# Patient Record
Sex: Female | Born: 1965 | Race: Black or African American | Hispanic: No | Marital: Single | Smoking: Never smoker
Health system: Southern US, Community
[De-identification: ages and names within clinical notes are randomized; demographics above are authoritative.]

## PROBLEM LIST (undated history)

## (undated) DIAGNOSIS — K838 Other specified diseases of biliary tract: Secondary | ICD-10-CM

## (undated) HISTORY — PX: TONSILLECTOMY: SUR1361

---

## 1988-02-14 DIAGNOSIS — K838 Other specified diseases of biliary tract: Secondary | ICD-10-CM

## 1988-02-14 HISTORY — DX: Other specified diseases of biliary tract: K83.8

## 1988-02-14 HISTORY — PX: TUBAL LIGATION: SHX77

## 2010-02-13 HISTORY — PX: BILIARY TUBE PLACEMENT (ARMC HX): HXRAD1689

## 2014-09-24 ENCOUNTER — Other Ambulatory Visit (HOSPITAL_BASED_OUTPATIENT_CLINIC_OR_DEPARTMENT_OTHER): Payer: Self-pay | Admitting: Chiropractor

## 2014-09-24 DIAGNOSIS — M545 Low back pain: Secondary | ICD-10-CM

## 2014-09-24 DIAGNOSIS — M542 Cervicalgia: Secondary | ICD-10-CM

## 2014-09-30 ENCOUNTER — Other Ambulatory Visit (HOSPITAL_BASED_OUTPATIENT_CLINIC_OR_DEPARTMENT_OTHER): Payer: Self-pay | Admitting: Chiropractor

## 2014-09-30 DIAGNOSIS — M5020 Other cervical disc displacement, unspecified cervical region: Secondary | ICD-10-CM

## 2014-09-30 DIAGNOSIS — M542 Cervicalgia: Secondary | ICD-10-CM

## 2014-09-30 DIAGNOSIS — S242XXA Injury of nerve root of thoracic spine, initial encounter: Secondary | ICD-10-CM

## 2014-09-30 DIAGNOSIS — M5430 Sciatica, unspecified side: Secondary | ICD-10-CM

## 2014-09-30 DIAGNOSIS — M5126 Other intervertebral disc displacement, lumbar region: Secondary | ICD-10-CM

## 2014-09-30 DIAGNOSIS — S142XXA Injury of nerve root of cervical spine, initial encounter: Secondary | ICD-10-CM

## 2014-10-03 ENCOUNTER — Ambulatory Visit (HOSPITAL_BASED_OUTPATIENT_CLINIC_OR_DEPARTMENT_OTHER)
Admission: RE | Admit: 2014-10-03 | Discharge: 2014-10-03 | Disposition: A | Discharge status: Home / Self Care | Payer: Self-pay | Source: Ambulatory Visit | Attending: Chiropractor | Admitting: Chiropractor

## 2014-10-03 DIAGNOSIS — M5020 Other cervical disc displacement, unspecified cervical region: Secondary | ICD-10-CM

## 2014-10-03 DIAGNOSIS — S242XXA Injury of nerve root of thoracic spine, initial encounter: Secondary | ICD-10-CM

## 2014-10-03 DIAGNOSIS — M542 Cervicalgia: Secondary | ICD-10-CM | POA: Insufficient documentation

## 2014-10-03 DIAGNOSIS — M5126 Other intervertebral disc displacement, lumbar region: Secondary | ICD-10-CM

## 2014-10-03 DIAGNOSIS — R200 Anesthesia of skin: Secondary | ICD-10-CM | POA: Insufficient documentation

## 2014-10-03 DIAGNOSIS — M5137 Other intervertebral disc degeneration, lumbosacral region: Secondary | ICD-10-CM | POA: Insufficient documentation

## 2014-10-03 DIAGNOSIS — M5430 Sciatica, unspecified side: Secondary | ICD-10-CM

## 2014-10-03 DIAGNOSIS — S142XXA Injury of nerve root of cervical spine, initial encounter: Secondary | ICD-10-CM

## 2015-04-20 ENCOUNTER — Emergency Department (HOSPITAL_BASED_OUTPATIENT_CLINIC_OR_DEPARTMENT_OTHER)
Admission: EM | Admit: 2015-04-20 | Discharge: 2015-04-21 | Disposition: A | Discharge status: Home / Self Care | Payer: BLUE CROSS/BLUE SHIELD | Attending: Emergency Medicine | Admitting: Emergency Medicine

## 2015-04-20 ENCOUNTER — Encounter (HOSPITAL_BASED_OUTPATIENT_CLINIC_OR_DEPARTMENT_OTHER): Payer: Self-pay | Admitting: Emergency Medicine

## 2015-04-20 DIAGNOSIS — Z8719 Personal history of other diseases of the digestive system: Secondary | ICD-10-CM | POA: Diagnosis not present

## 2015-04-20 DIAGNOSIS — B9689 Other specified bacterial agents as the cause of diseases classified elsewhere: Secondary | ICD-10-CM

## 2015-04-20 DIAGNOSIS — Z202 Contact with and (suspected) exposure to infections with a predominantly sexual mode of transmission: Secondary | ICD-10-CM | POA: Diagnosis present

## 2015-04-20 DIAGNOSIS — N760 Acute vaginitis: Secondary | ICD-10-CM | POA: Insufficient documentation

## 2015-04-20 HISTORY — DX: Other specified diseases of biliary tract: K83.8

## 2015-04-20 LAB — WET PREP, GENITAL
Sperm: NONE SEEN
TRICH WET PREP: NONE SEEN
Yeast Wet Prep HPF POC: NONE SEEN

## 2015-04-20 MED ORDER — CEFTRIAXONE SODIUM 250 MG IJ SOLR
250.0000 mg | Freq: Once | INTRAMUSCULAR | Status: AC
  Administered 2015-04-20: 250 mg via INTRAMUSCULAR
  Filled 2015-04-20: qty 250

## 2015-04-20 MED ORDER — AZITHROMYCIN 250 MG PO TABS
1000.0000 mg | ORAL_TABLET | Freq: Once | ORAL | Status: AC
  Administered 2015-04-20: 1000 mg via ORAL
  Filled 2015-04-20: qty 4

## 2015-04-20 MED ORDER — LIDOCAINE HCL (PF) 1 % IJ SOLN
INTRAMUSCULAR | Status: AC
  Administered 2015-04-20: 5 mL
  Filled 2015-04-20: qty 5

## 2015-04-20 NOTE — ED Provider Notes (Signed)
CSN: [id number]     Arrival date & time 04/20/15  2203 History   First MD Initiated Contact with Patient 04/20/15 2305     Chief Complaint  Patient presents with  . Exposure to STD   (Consider location/radiation/quality/duration/timing/severity/associated sxs/prior Treatment) HPI 50 y.o. female presents to the Emergency Department today for STD check. States that she received a call at work today to be check for [employer]. Does not endorse any abdominal pain. No vaginal discharge. No N/V/D. No fevers. Last intercourse was 2 weeks ago. One partner. On menstrual cycle currently. No other symptoms noted.   Past Medical History  Diagnosis Date  . Ulcer of bile duct 1990   Past Surgical History  Procedure Laterality Date  . Biliary tube placement ([facility] hx)  2012  . Tonsillectomy    . Tubal ligation  1990   No family history on file. Social History  Substance Use Topics  . Smoking status: Never Smoker   . Smokeless tobacco: None  . Alcohol Use: No   OB History    No data available     Review of Systems ROS reviewed and all are negative for acute change except as noted in the HPI.  Allergies  Dilaudid  Home Medications   Prior to Admission medications   Not on File   BP 146/88 mmHg  Pulse 72  Temp(Src) 98.6 F (37 C) (Oral)  Resp 18  Ht 5\' 3"  (1.6 m)  Wt 74.844 kg  BMI 29.24 kg/m2  SpO2 100%  LMP 04/20/2015 (Exact Date) Physical Exam  Constitutional: She is oriented to person, place, and time. She appears well-developed and well-nourished.  HENT:  Head: Normocephalic and atraumatic.  Eyes: EOM are normal. Pupils are equal, round, and reactive to light.  Neck: Normal range of motion. No tracheal deviation present.  Cardiovascular: Normal rate, regular rhythm and normal heart sounds.   Pulmonary/Chest: Effort normal and breath sounds normal.  Abdominal: Soft.  Musculoskeletal: Normal range of motion.  Neurological: She is alert and oriented to person, place, and time.   Skin: Skin is warm and dry.  Psychiatric: She has a normal mood and affect. Her behavior is normal. Thought content normal.  Nursing note and vitals reviewed.  Exam performed by Eston Estersyler M Peggye Poon,  exam chaperoned Date: 04/20/2015 Pelvic exam: normal external genitalia without evidence of trauma. VULVA: normal appearing vulva with no masses, tenderness or lesion. VAGINA: normal appearing vagina with normal color and discharge, no lesions. CERVIX: normal appearing cervix without lesions, cervical motion tenderness absent, cervical os closed with out purulent discharge; vaginal discharge - white and bloody, Wet prep and DNA probe for chlamydia and GC obtained.   ADNEXA: normal adnexa in size, nontender and no masses UTERUS: uterus is normal size, shape, consistency and nontender.     ED Course  Procedures (including critical care time) Labs Review Labs Reviewed  WET PREP, GENITAL - Abnormal; Notable for the following:    Clue Cells Wet Prep HPF POC PRESENT (*)    WBC, Wet Prep HPF POC FEW (*)    All other components within normal limits  GC/CHLAMYDIA PROBE AMP ([place]) NOT AT [facility]    Imaging Review No results found. I have personally reviewed and evaluated these images and lab results as part of my medical decision-making.   EKG Interpretation None      MDM  I have reviewed and evaluated the relevant laboratory values. I have reviewed the relevant previous healthcare records. I obtained HPI from  historian.  ED Course:  Assessment: 72y F presents to ED for STD check. Received call from partner who tested positive. No symptoms currently. No abd pain. No dysuria. No vaginal discharge. Currently on menstrual cycle. On exam, pt in NAD. VSS. Afebrile. GU showed blood and white discharge. No CMT. No Adnexal tenderness. Wet prep showed BV. GC obtained. Given Azithro + Rocephin. Given Rx Flagyl. At time of discharge, Patient is in no acute distress. Vital Signs are stable. Patient is  able to ambulate. Patient able to tolerate PO.    Disposition/Plan:  DC Home Additional Verbal discharge instructions given and discussed with patient.  Return precautions given Pt acknowledges and agrees with plan  Supervising Physician April Palumbo, MD   Final diagnoses:  BV (bacterial vaginosis)      Audry Pili, PA-C 04/21/15 0001  April Palumbo, MD 04/21/15 0008

## 2015-04-20 NOTE — ED Notes (Addendum)
50 yo female states that her partner notified her today that he tested positive syphillis and that she needed to go get checked out. Denies any symptoms. Last intercourse 2 weeks ago.

## 2015-04-21 LAB — GC/CHLAMYDIA PROBE AMP (~~LOC~~) NOT AT ARMC
CHLAMYDIA, DNA PROBE: NEGATIVE
NEISSERIA GONORRHEA: NEGATIVE

## 2015-04-21 MED ORDER — METRONIDAZOLE 500 MG PO TABS: 500.0000 mg | ORAL_TABLET | Freq: Two times a day (BID) | ORAL | Status: DC

## 2015-04-21 NOTE — Discharge Instructions (Signed)
Please read and follow all provided instructions.  Your diagnoses today include:  1. BV (bacterial vaginosis)    Tests performed today include:  Test for gonorrhea and chlamydia. You will be notified by telephone if you have a positive result.  Vital signs. See below for your results today.   Medications:  You were treated for chlamydia (1 gram azithromycin pills) and gonorrhea (250mg  rocephin shot). Take Flagyl as Prescribed   Home care instructions:  Read educational materials contained in this packet and follow any instructions provided.   You should tell your partners about your infection and avoid having sex for one week to allow time for the medicine to work.  Follow-up instructions: You should follow-up with [facility] to be tested for HIV, syphilis, and hepatitis -- all of which can be transmitted by sexual contact. We do not routinely screen for these in the Emergency Department.  STD Testing:  [facility] [place] [place] [facility]  [address], [place], phone [phone] or [phone]    Monday - Friday, call for an appointment  [facility] [place] [place] [facility]  [address], [place], phone [phone] or [phone]   Monday - Friday, call for an appointment  Return instructions:   Please return to the Emergency Department if you experience worsening symptoms.   Please return if you have any other emergent concerns.  Additional Information:  Your vital signs today were: BP 146/88 mmHg   Pulse 72   Temp(Src) 98.6 F (37 C) (Oral)   Resp 18   Ht 5\' 3"  (1.6 m)   Wt 74.844 kg   BMI 29.24 kg/m2   SpO2 100%   LMP 04/20/2015 (Exact Date) If your blood pressure (BP) was elevated above 135/85 this visit, please have this repeated by your doctor within one month. --------------

## 2015-05-20 ENCOUNTER — Encounter (HOSPITAL_BASED_OUTPATIENT_CLINIC_OR_DEPARTMENT_OTHER): Payer: Self-pay

## 2015-05-20 ENCOUNTER — Emergency Department (HOSPITAL_BASED_OUTPATIENT_CLINIC_OR_DEPARTMENT_OTHER)
Admission: EM | Admit: 2015-05-20 | Discharge: 2015-05-20 | Disposition: A | Discharge status: Home / Self Care | Payer: BLUE CROSS/BLUE SHIELD | Attending: Emergency Medicine | Admitting: Emergency Medicine

## 2015-05-20 DIAGNOSIS — Z792 Long term (current) use of antibiotics: Secondary | ICD-10-CM | POA: Insufficient documentation

## 2015-05-20 DIAGNOSIS — L299 Pruritus, unspecified: Secondary | ICD-10-CM | POA: Insufficient documentation

## 2015-05-20 DIAGNOSIS — L282 Other prurigo: Secondary | ICD-10-CM

## 2015-05-20 DIAGNOSIS — M7989 Other specified soft tissue disorders: Secondary | ICD-10-CM

## 2015-05-20 DIAGNOSIS — R2243 Localized swelling, mass and lump, lower limb, bilateral: Secondary | ICD-10-CM | POA: Diagnosis present

## 2015-05-20 LAB — CBC WITH DIFFERENTIAL/PLATELET
BASOS ABS: 0 10*3/uL (ref 0.0–0.1)
Basophils Relative: 0 %
Eosinophils Absolute: 0.5 10*3/uL (ref 0.0–0.7)
Eosinophils Relative: 5 %
HEMATOCRIT: 27.2 % — AB (ref 36.0–46.0)
HEMOGLOBIN: 8.4 g/dL — AB (ref 12.0–15.0)
LYMPHS PCT: 26 %
Lymphs Abs: 2.5 10*3/uL (ref 0.7–4.0)
MCH: 24.4 pg — ABNORMAL LOW (ref 26.0–34.0)
MCHC: 30.9 g/dL (ref 30.0–36.0)
MCV: 79.1 fL (ref 78.0–100.0)
MONOS PCT: 7 %
Monocytes Absolute: 0.7 10*3/uL (ref 0.1–1.0)
NEUTROS ABS: 6.1 10*3/uL (ref 1.7–7.7)
NEUTROS PCT: 62 %
Platelets: 383 10*3/uL (ref 150–400)
RBC: 3.44 MIL/uL — AB (ref 3.87–5.11)
RDW: 18 % — ABNORMAL HIGH (ref 11.5–15.5)
WBC: 9.8 10*3/uL (ref 4.0–10.5)

## 2015-05-20 LAB — URINALYSIS, ROUTINE W REFLEX MICROSCOPIC
BILIRUBIN URINE: NEGATIVE
GLUCOSE, UA: NEGATIVE mg/dL
HGB URINE DIPSTICK: NEGATIVE
Ketones, ur: NEGATIVE mg/dL
Leukocytes, UA: NEGATIVE
Nitrite: NEGATIVE
Protein, ur: NEGATIVE mg/dL
SPECIFIC GRAVITY, URINE: 1.022 (ref 1.005–1.030)
pH: 6 (ref 5.0–8.0)

## 2015-05-20 LAB — BASIC METABOLIC PANEL
ANION GAP: 7 (ref 5–15)
BUN: 18 mg/dL (ref 6–20)
CHLORIDE: 107 mmol/L (ref 101–111)
CO2: 23 mmol/L (ref 22–32)
Calcium: 9.1 mg/dL (ref 8.9–10.3)
Creatinine, Ser: 0.7 mg/dL (ref 0.44–1.00)
GFR calc Af Amer: >60 mL/min (ref >60)
GFR calc non Af Amer: >60 mL/min (ref >60)
GLUCOSE: 102 mg/dL — AB (ref 65–99)
POTASSIUM: 3.9 mmol/L (ref 3.5–5.1)
Sodium: 137 mmol/L (ref 135–145)

## 2015-05-20 MED ORDER — FAMOTIDINE 20 MG PO TABS: 20.0000 mg | ORAL_TABLET | Freq: Every day | ORAL | Status: AC

## 2015-05-20 MED ORDER — PREDNISONE 50 MG PO TABS
60.0000 mg | ORAL_TABLET | Freq: Once | ORAL | Status: AC
  Administered 2015-05-20: 60 mg via ORAL
  Filled 2015-05-20: qty 1

## 2015-05-20 MED ORDER — FAMOTIDINE 20 MG PO TABS
20.0000 mg | ORAL_TABLET | Freq: Once | ORAL | Status: AC
  Administered 2015-05-20: 20 mg via ORAL
  Filled 2015-05-20: qty 1

## 2015-05-20 MED ORDER — DIPHENHYDRAMINE HCL 25 MG PO CAPS
25.0000 mg | ORAL_CAPSULE | Freq: Once | ORAL | Status: AC
Start: 2015-05-20 — End: 2015-05-20
  Administered 2015-05-20: 25 mg via ORAL
  Filled 2015-05-20: qty 1

## 2015-05-20 MED ORDER — PREDNISONE 20 MG PO TABS: 60.0000 mg | ORAL_TABLET | Freq: Every day | ORAL | Status: DC

## 2015-05-20 MED ORDER — DIPHENHYDRAMINE HCL 25 MG PO CAPS: 25.0000 mg | ORAL_CAPSULE | Freq: Three times a day (TID) | ORAL | Status: AC | PRN

## 2015-05-20 NOTE — ED Notes (Addendum)
Pt c/o bilateral leg/ankle/feet swelling, states when touching/pressing water is weeping out

## 2015-05-20 NOTE — Discharge Instructions (Signed)
You were seen today for leg swelling and itchy rash. It appears she may be having an allergic reaction. This may be related to recent medication; however, you've not been on that medication in over a week. Your lab work is reassuring with the exception of known anemia. Continue prednisone at home. If he developed lesions in your mouth or your skin lesions began to blister coming you need to be reevaluated immediately.  Allergies An allergy is an abnormal reaction to a substance by the body's defense system (immune system). Allergies can develop at any age. WHAT CAUSES ALLERGIES? An allergic reaction happens when the immune system mistakenly reacts to a normally harmless substance, called an allergen, as if it were harmful. The immune system releases antibodies to fight the substance. Antibodies eventually release a chemical called histamine into the bloodstream. The release of histamine is meant to protect the body from infection, but it also causes discomfort. An allergic reaction can be triggered by:  Eating an allergen.  Inhaling an allergen.  Touching an allergen. WHAT TYPES OF ALLERGIES ARE THERE? There are many types of allergies. Common types include:  Seasonal allergies. People with this type of allergy are usually allergic to substances that are only present during certain seasons, such as molds and pollens.  Food allergies.  Drug allergies.  Insect allergies.  Animal dander allergies. WHAT ARE SYMPTOMS OF ALLERGIES? Possible allergy symptoms include:  Swelling of the lips, face, tongue, mouth, or throat.  Sneezing, coughing, or wheezing.  Nasal congestion.  Tingling in the mouth.  Rash.  Itching.  Itchy, red, swollen areas of skin (hives).  Watery eyes.  Vomiting.  Diarrhea.  Dizziness.  Lightheadedness.  Fainting.  Trouble breathing or swallowing.  Chest tightness.  Rapid heartbeat. HOW ARE ALLERGIES DIAGNOSED? Allergies are diagnosed with a  medical and family history and one or more of the following:  Skin tests.  Blood tests.  A food diary. A food diary is a record of all the foods and drinks you have in a day and of all the symptoms you experience.  The results of an elimination diet. An elimination diet involves eliminating foods from your diet and then adding them back in one by one to find out if a certain food causes an allergic reaction. HOW ARE ALLERGIES TREATED? There is no cure for allergies, but allergic reactions can be treated with medicine. Severe reactions usually need to be treated at a hospital. HOW CAN REACTIONS BE PREVENTED? The best way to prevent an allergic reaction is by avoiding the substance you are allergic to. Allergy shots and medicines can also help prevent reactions in some cases. People with severe allergic reactions may be able to prevent a life-threatening reaction called anaphylaxis with a medicine given right after exposure to the allergen.   This information is not intended to replace advice given to you by your health care provider. Make sure you discuss any questions you have with your health care provider.   Document Released: 04/25/2002 Document Revised: 02/20/2014 Document Reviewed: 11/11/2013 Elsevier Interactive Patient Education 2016 Elsevier Inc.  Edema Edema is an abnormal buildup of fluids in your bodytissues. Edema is somewhatdependent on gravity to pull the fluid to the lowest place in your body. That makes the condition more common in the legs and thighs (lower extremities). Painless swelling of the feet and ankles is common and becomes more likely as you get older. It is also common in looser tissues, like around your eyes.  When the affected  area is squeezed, the fluid may move out of that spot and leave a dent for a few moments. This dent is called pitting.  CAUSES  There are many possible causes of edema. Eating too much salt and being on your feet or sitting for a long  time can cause edema in your legs and ankles. Hot weather may make edema worse. Common medical causes of edema include:  Heart failure.  Liver disease.  Kidney disease.  Weak blood vessels in your legs.  Cancer.  An injury.  Pregnancy.  Some medications.  Obesity. SYMPTOMS  Edema is usually painless.Your skin may look swollen or shiny.  DIAGNOSIS  Your health care provider may be able to diagnose edema by asking about your medical history and doing a physical exam. You may need to have tests such as X-rays, an electrocardiogram, or blood tests to check for medical conditions that may cause edema.  TREATMENT  Edema treatment depends on the cause. If you have heart, liver, or kidney disease, you need the treatment appropriate for these conditions. General treatment may include:  Elevation of the affected body part above the level of your heart.  Compression of the affected body part. Pressure from elastic bandages or support stockings squeezes the tissues and forces fluid back into the blood vessels. This keeps fluid from entering the tissues.  Restriction of fluid and salt intake.  Use of a water pill (diuretic). These medications are appropriate only for some types of edema. They pull fluid out of your body and make you urinate more often. This gets rid of fluid and reduces swelling, but diuretics can have side effects. Only use diuretics as directed by your health care provider. HOME CARE INSTRUCTIONS   Keep the affected body part above the level of your heart when you are lying down.   Do not sit still or stand for prolonged periods.   Do not put anything directly under your knees when lying down.  Do not wear constricting clothing or garters on your upper legs.   Exercise your legs to work the fluid back into your blood vessels. This may help the swelling go down.   Wear elastic bandages or support stockings to reduce ankle swelling as directed by your health  care provider.   Eat a low-salt diet to reduce fluid if your health care provider recommends it.   Only take medicines as directed by your health care provider. SEEK MEDICAL CARE IF:   Your edema is not responding to treatment.  You have heart, liver, or kidney disease and notice symptoms of edema.  You have edema in your legs that does not improve after elevating them.   You have sudden and unexplained weight gain. SEEK IMMEDIATE MEDICAL CARE IF:   You develop shortness of breath or chest pain.   You cannot breathe when you lie down.  You develop pain, redness, or warmth in the swollen areas.   You have heart, liver, or kidney disease and suddenly get edema.  You have a fever and your symptoms suddenly get worse. MAKE SURE YOU:   Will watch your condition.  Will get help right away if you are not doing well or get worse.   This information is not intended to replace advice given to you by your health care provider. Make sure you discuss any questions you have with your health care provider.   Document Released: 01/30/2005 Document Revised: 02/20/2014 Document Reviewed: 11/22/2012 Elsevier Interactive Patient Education [employer]

## 2015-05-20 NOTE — ED Provider Notes (Signed)
CSN: [id number]     Arrival date & time 05/20/15  5784 History   First MD Initiated Contact with Patient 05/20/15 0400     Chief Complaint  Patient presents with  . Leg Swelling     (Consider location/radiation/quality/duration/timing/severity/associated sxs/prior Treatment) HPI  This is a 50 year old female who presents with three-day history of bilateral lower extremity swelling. Patient states that for the last 3 days she has noted intense pruritus of the bilateral upper and lower extremities. It started with her bilateral forearms then went to her upper legs and lower legs. She noticed a fine rash. She started taking Benadryl which seemed to help some. However, she developed worsening lower extreme be swelling. No history of heart failure. Patient denies any recent changes in medications, new foods, new detergents or soaps. She did recently finish a course of Flagyl 7 days ago but other than that has not been on any new medications. She denies any shortness of breath or chest pain. No rash noted over the oral or mucous membranes  Past Medical History  Diagnosis Date  . Ulcer of bile duct 1990   Past Surgical History  Procedure Laterality Date  . Biliary tube placement ([facility] hx)  2012  . Tonsillectomy    . Tubal ligation  1990   No family history on file. Social History  Substance Use Topics  . Smoking status: Never Smoker   . Smokeless tobacco: None  . Alcohol Use: No   OB History    No data available     Review of Systems  Constitutional: Negative for fever.  Respiratory: Negative for chest tightness and shortness of breath.   Cardiovascular: Positive for leg swelling. Negative for chest pain.  Skin: Positive for rash. Negative for wound.  All other systems reviewed and are negative.     Allergies  Dilaudid  Home Medications   Prior to Admission medications   Medication Sig Start Date End Date Taking? Authorizing Provider  diphenhydrAMINE (BENADRYL) 25 mg  capsule Take 1 capsule (25 mg total) by mouth every 8 (eight) hours as needed. 05/20/15   Shon Baton, MD  famotidine (PEPCID) 20 MG tablet Take 1 tablet (20 mg total) by mouth daily. 05/20/15   Shon Baton, MD  metroNIDAZOLE (FLAGYL) 500 MG tablet Take 1 tablet (500 mg total) by mouth 2 (two) times daily. 04/21/15   Audry Pili, PA-C  predniSONE (DELTASONE) 20 MG tablet Take 3 tablets (60 mg total) by mouth daily with breakfast. 05/20/15   Shon Baton, MD   BP 121/76 mmHg  Pulse 86  Temp(Src) 98.2 F (36.8 C) (Oral)  Resp 18  Ht  (1.6 m)  Wt 169 lb (76.658 kg)  BMI 29.94 kg/m2  SpO2 99%  LMP 05/16/2015 Physical Exam  Constitutional: She is oriented to person, place, and time. She appears well-developed and well-nourished.  HENT:  Head: Normocephalic and atraumatic.  No oral lesions noted  Cardiovascular: Normal rate, regular rhythm and normal heart sounds.   Pulmonary/Chest: Effort normal and breath sounds normal. No respiratory distress. She has no wheezes.  Musculoskeletal: She exhibits edema.  2+ bilateral lower extremity edema symmetric, 2+ DP pulse  Neurological: She is alert and oriented to person, place, and time.  Skin: Skin is warm and dry.  Erythema and excoriation noted over the bilateral lower extremities, no skin sloughing, fine papular rash noted over the bilateral upper forearms  Psychiatric: She has a normal mood and affect.  Nursing note and vitals  reviewed.   ED Course  Procedures (including critical care time) Labs Review Labs Reviewed  CBC WITH DIFFERENTIAL/PLATELET - Abnormal; Notable for the following:    RBC 3.44 (*)    Hemoglobin 8.4 (*)    HCT 27.2 (*)    MCH 24.4 (*)    RDW 18.0 (*)    All other components within normal limits  BASIC METABOLIC PANEL - Abnormal; Notable for the following:    Glucose, Bld 102 (*)    All other components within normal limits  URINALYSIS, ROUTINE W REFLEX MICROSCOPIC (NOT AT [facility])    Imaging  Review No results found. I have personally reviewed and evaluated these images and lab results as part of my medical decision-making.   EKG Interpretation None      MDM   Final diagnoses:  Pruritic rash  Leg swelling    Patient presents with itching, rash, and lower extremity swelling. Denies any new exposures but did finish a recent course of metronidazole.  She is otherwise nontoxic. The rash and itching seems to be allergic in nature versus a dermatitis. No evidence of Stevens-Johnson. Swelling corresponds with lower extremity rash. Basic labwork obtained and largely reassuring with exception of anemia. Patient reports noted history of anemia. No protein in the urine. She was given prednisone, Benadryl, and Pepcid for presumed allergy versus contact dermatitis. On recheck, patient states that she feels better. Discussed with patient that she needs to watch her symptoms closely. She will continue prednisone at home as well as Pepcid and Benadryl as needed. She needs recheck in 2 days. If she develops oral lesions or worsening skin lesions, she needs to be reevaluated immediately. Encouraged patient to elevate her extremities and stay off her feet as much as possible.  After history, exam, and medical workup I feel the patient has been appropriately medically screened and is safe for discharge home. Pertinent diagnoses were discussed with the patient. Patient was given return precautions.     Shon Batonourtney F Asheley Hellberg, MD 05/20/15 [phone]

## 2015-12-03 ENCOUNTER — Emergency Department (HOSPITAL_BASED_OUTPATIENT_CLINIC_OR_DEPARTMENT_OTHER)
Admission: EM | Admit: 2015-12-03 | Discharge: 2015-12-03 | Disposition: A | Discharge status: Home / Self Care | Payer: BLUE CROSS/BLUE SHIELD | Attending: Emergency Medicine | Admitting: Emergency Medicine

## 2015-12-03 ENCOUNTER — Encounter (HOSPITAL_BASED_OUTPATIENT_CLINIC_OR_DEPARTMENT_OTHER): Payer: Self-pay

## 2015-12-03 DIAGNOSIS — B349 Viral infection, unspecified: Secondary | ICD-10-CM | POA: Diagnosis not present

## 2015-12-03 DIAGNOSIS — R51 Headache: Secondary | ICD-10-CM | POA: Diagnosis present

## 2015-12-03 MED ORDER — ACETAMINOPHEN 325 MG PO TABS
650.0000 mg | ORAL_TABLET | Freq: Once | ORAL | Status: AC
  Administered 2015-12-03: 650 mg via ORAL
  Filled 2015-12-03: qty 2

## 2015-12-03 MED ORDER — IBUPROFEN 600 MG PO TABS
600.0000 mg | ORAL_TABLET | Freq: Three times a day (TID) | ORAL | 0 refills | Status: AC | PRN
Start: 2015-12-03 — End: ?

## 2015-12-03 NOTE — ED Triage Notes (Signed)
Pt c/o generalized body aches with congestion, cough and chills that started today

## 2015-12-03 NOTE — ED Provider Notes (Signed)
[facility] Provider Note: [name] [name], MD, FACEP  CSN: [id number] MRN: [id number] ARRIVAL: 12/03/15 at 0053 ROOM: [facility]11/[facility]11   CHIEF COMPLAINT  Headache   HISTORY OF PRESENT ILLNESS  Bethany Walker is a 50 y.o. female with generalized body aches, headache, malaise and subjective fever since yesterday. She had a temperature of 99.5 on arrival and stated that her headache was severe. She was given acetaminophen with significant improvement. She had some transient nausea which is improved with drinking ginger ale. She denies cold symptoms such as rhinorrhea, sore throat, shortness of breath or cough. She has not had any vomiting or diarrhea.    Past Medical History:  Diagnosis Date  . Ulcer of bile duct 1990    Past Surgical History:  Procedure Laterality Date  . BILIARY TUBE PLACEMENT ([facility] HX)  2012  . TONSILLECTOMY    . TUBAL LIGATION  1990    No family history on file.  Social History  Substance Use Topics  . Smoking status: Never Smoker  . Smokeless tobacco: Not on file  . Alcohol use No    Prior to Admission medications   Medication Sig Start Date End Date Taking? Authorizing Provider  diphenhydrAMINE (BENADRYL) 25 mg capsule Take 1 capsule (25 mg total) by mouth every 8 (eight) hours as needed. 05/20/15   Shon Batonourtney F Horton, MD  famotidine (PEPCID) 20 MG tablet Take 1 tablet (20 mg total) by mouth daily. 05/20/15   Shon Batonourtney F Horton, MD  metroNIDAZOLE (FLAGYL) 500 MG tablet Take 1 tablet (500 mg total) by mouth 2 (two) times daily. 04/21/15   Audry Piliyler Mohr, PA-C  predniSONE (DELTASONE) 20 MG tablet Take 3 tablets (60 mg total) by mouth daily with breakfast. 05/20/15   Shon Batonourtney F Horton, MD    Allergies Dilaudid [hydromorphone hcl]   REVIEW OF SYSTEMS  Negative except as noted here or in the History of Present Illness.   PHYSICAL EXAMINATION  Initial Vital Signs Blood pressure 139/88, pulse 95, temperature 99.5 F (37.5 C), temperature source Oral,  resp. rate 18, height 5\' 3"  (1.6 m), weight 170 lb (77.1 kg), SpO2 100 %.  Examination General: Well-developed, well-nourished female in no acute distress; appearance consistent with age of record HENT: normocephalic; atraumatic; pharynx normal Eyes: pupils equal, round and reactive to light; extraocular muscles intact Neck: supple Heart: regular rate and rhythm Lungs: clear to auscultation bilaterally Abdomen: soft; nondistended; nontender;  bowel sounds present Extremities: No deformity; full range of motion; pulses normal Neurologic: Awake, alert and oriented; motor function intact in all extremities and symmetric; no facial droop Skin: Warm and dry Psychiatric: Normal mood and affect   RESULTS  Summary of this visit's results, reviewed by myself:   EKG Interpretation  Date/Time:    Ventricular Rate:    PR Interval:    QRS Duration:   QT Interval:    QTC Calculation:   R Axis:     Text Interpretation:        Laboratory Studies: No results found for this or any previous visit (from the past 24 hour(s)). Imaging Studies: No results found.  ED COURSE  Nursing notes and initial vitals signs, including pulse oximetry, reviewed.  Vitals:   12/03/15 0111  BP: 139/88  Pulse: 95  Resp: 18  Temp: 99.5 F (37.5 C)  TempSrc: Oral  SpO2: 100%  Weight: 170 lb (77.1 kg)  Height: 5\' 3"  (1.6 m)    PROCEDURES    ED DIAGNOSES     ICD-9-CM ICD-10-CM  1. Viral illness 079.99 B34.9        Paula Libra, MD 12/03/15 [phone]

## 2015-12-03 NOTE — ED Notes (Signed)
Pt verbalizes understanding of d/c instructions and denies any further needs at this time. 

## 2016-04-20 ENCOUNTER — Emergency Department (HOSPITAL_BASED_OUTPATIENT_CLINIC_OR_DEPARTMENT_OTHER)
Admission: EM | Admit: 2016-04-20 | Discharge: 2016-04-20 | Disposition: A | Discharge status: Home / Self Care | Payer: BLUE CROSS/BLUE SHIELD | Attending: Emergency Medicine | Admitting: Emergency Medicine

## 2016-04-20 ENCOUNTER — Emergency Department (HOSPITAL_BASED_OUTPATIENT_CLINIC_OR_DEPARTMENT_OTHER): Payer: BLUE CROSS/BLUE SHIELD

## 2016-04-20 ENCOUNTER — Encounter (HOSPITAL_BASED_OUTPATIENT_CLINIC_OR_DEPARTMENT_OTHER): Payer: Self-pay | Admitting: Emergency Medicine

## 2016-04-20 DIAGNOSIS — Z79899 Other long term (current) drug therapy: Secondary | ICD-10-CM | POA: Diagnosis not present

## 2016-04-20 DIAGNOSIS — R1013 Epigastric pain: Secondary | ICD-10-CM | POA: Diagnosis present

## 2016-04-20 DIAGNOSIS — R05 Cough: Secondary | ICD-10-CM | POA: Insufficient documentation

## 2016-04-20 DIAGNOSIS — A084 Viral intestinal infection, unspecified: Secondary | ICD-10-CM | POA: Diagnosis not present

## 2016-04-20 LAB — COMPREHENSIVE METABOLIC PANEL
ALT: 15 U/L (ref 14–54)
AST: 23 U/L (ref 15–41)
Albumin: 3.8 g/dL (ref 3.5–5.0)
Alkaline Phosphatase: 115 U/L (ref 38–126)
Anion gap: 6 (ref 5–15)
BUN: 10 mg/dL (ref 6–20)
CHLORIDE: 107 mmol/L (ref 101–111)
CO2: 23 mmol/L (ref 22–32)
Calcium: 9.3 mg/dL (ref 8.9–10.3)
Creatinine, Ser: 0.65 mg/dL (ref 0.44–1.00)
Glucose, Bld: 98 mg/dL (ref 65–99)
POTASSIUM: 4.1 mmol/L (ref 3.5–5.1)
Sodium: 136 mmol/L (ref 135–145)
Total Bilirubin: 0.2 mg/dL — ABNORMAL LOW (ref 0.3–1.2)
Total Protein: 7.8 g/dL (ref 6.5–8.1)

## 2016-04-20 LAB — CBC WITH DIFFERENTIAL/PLATELET
Basophils Absolute: 0 10*3/uL (ref 0.0–0.1)
Basophils Relative: 0 %
EOS ABS: 0.2 10*3/uL (ref 0.0–0.7)
EOS PCT: 2 %
HCT: 34.4 % — ABNORMAL LOW (ref 36.0–46.0)
Hemoglobin: 11 g/dL — ABNORMAL LOW (ref 12.0–15.0)
LYMPHS PCT: 26 %
Lymphs Abs: 2.5 10*3/uL (ref 0.7–4.0)
MCH: 26.4 pg (ref 26.0–34.0)
MCHC: 32 g/dL (ref 30.0–36.0)
MCV: 82.5 fL (ref 78.0–100.0)
MONO ABS: 0.6 10*3/uL (ref 0.1–1.0)
Monocytes Relative: 6 %
Neutro Abs: 6.2 10*3/uL (ref 1.7–7.7)
Neutrophils Relative %: 66 %
PLATELETS: 363 10*3/uL (ref 150–400)
RBC: 4.17 MIL/uL (ref 3.87–5.11)
RDW: 16.5 % — AB (ref 11.5–15.5)
WBC: 9.4 10*3/uL (ref 4.0–10.5)

## 2016-04-20 LAB — URINALYSIS, ROUTINE W REFLEX MICROSCOPIC
BILIRUBIN URINE: NEGATIVE
Glucose, UA: NEGATIVE mg/dL
HGB URINE DIPSTICK: NEGATIVE
KETONES UR: NEGATIVE mg/dL
Leukocytes, UA: NEGATIVE
Nitrite: NEGATIVE
Protein, ur: NEGATIVE mg/dL
SPECIFIC GRAVITY, URINE: 1.009 (ref 1.005–1.030)
pH: 7.5 (ref 5.0–8.0)

## 2016-04-20 LAB — LIPASE, BLOOD: LIPASE: 18 U/L (ref 11–51)

## 2016-04-20 MED ORDER — SODIUM CHLORIDE 0.9 % IV BOLUS (SEPSIS)
1000.0000 mL | Freq: Once | INTRAVENOUS | Status: AC
  Administered 2016-04-20: 1000 mL via INTRAVENOUS

## 2016-04-20 MED ORDER — ONDANSETRON 4 MG PO TBDP: ORAL_TABLET | ORAL | 0 refills | Status: AC

## 2016-04-20 NOTE — ED Provider Notes (Signed)
[facility] Provider Note   CSN: [id number] Arrival date & time: 04/20/16  2041  By signing my name below, I, Bethany Walker, attest that this documentation has been prepared under the direction and in the presence of Bethany Mornavid Azreal Stthomas, NP. Electronically Signed: Linna Darnerussell Walker, Scribe. 04/20/2016. 10:02 PM.  History   Chief Complaint Chief Complaint  Patient presents with  . Abdominal Pain    The history is provided by the patient. No language interpreter was used.  Abdominal Pain   This is a new problem. The current episode started 2 days ago. The problem occurs hourly. The problem has not changed since onset.The pain is associated with an unknown factor. The pain is located in the epigastric region. The quality of the pain is cramping. The pain is moderate. Associated symptoms include fever, diarrhea, nausea, vomiting and headaches. Pertinent negatives include dysuria and frequency. The symptoms are aggravated by eating. Nothing relieves the symptoms.     HPI Comments: Bethany Walker is a 51 y.o. female who presents to the Emergency Department complaining of persistent, cramping abdominal pain beginning two days ago. She reports associated nausea, vomiting, diarrhea, headache, an occasional cough, and subjective fevers/chills. Pt notes she has been vomiting post-prandially and last vomited after drinking fluids this afternoon. She last ate a potato a few hours ago and has not regurgitated it. She notes her emesis has not appeared to be yellow or green. No known contacts with similar symptoms. No medications or treatments tried. She denies dysuria, urinary frequency, or any other associated symptoms.  Past Medical History:  Diagnosis Date  . Ulcer of bile duct 1990    There are no active problems to display for this patient.   Past Surgical History:  Procedure Laterality Date  . BILIARY TUBE PLACEMENT ([facility] HX)  2012  . TONSILLECTOMY    . TUBAL LIGATION  1990    OB History     No data available       Home Medications    Prior to Admission medications   Medication Sig Start Date End Date Taking? Authorizing Provider  diphenhydrAMINE (BENADRYL) 25 mg capsule Take 1 capsule (25 mg total) by mouth every 8 (eight) hours as needed. 05/20/15   Shon Batonourtney F Horton, MD  famotidine (PEPCID) 20 MG tablet Take 1 tablet (20 mg total) by mouth daily. 05/20/15   Shon Batonourtney F Horton, MD  ibuprofen (ADVIL,MOTRIN) 600 MG tablet Take 1 tablet (600 mg total) by mouth every 8 (eight) hours as needed (for fever or body aches). 12/03/15   Paula LibraJohn Molpus, MD    Family History History reviewed. No pertinent family history.  Social History Social History  Substance Use Topics  . Smoking status: Never Smoker  . Smokeless tobacco: Never Used  . Alcohol use No     Allergies   Dilaudid [hydromorphone hcl]   Review of Systems Review of Systems  Constitutional: Positive for chills and fever.  Respiratory: Positive for cough.   Gastrointestinal: Positive for abdominal pain, diarrhea, nausea and vomiting.  Genitourinary: Negative for dysuria and frequency.  Neurological: Positive for headaches.  All other systems reviewed and are negative.   Physical Exam Updated Vital Signs BP 135/83 (BP Location: Right Arm)   Pulse 80   Temp 99.3 F (37.4 C) (Oral)   Resp 12   Ht 5\' 3"  (1.6 m)   Wt 170 lb (77.1 kg)   SpO2 100%   BMI 30.11 kg/m   Physical Exam  Constitutional: She is oriented to person,  place, and time. She appears well-developed and well-nourished. No distress.  HENT:  Head: Normocephalic and atraumatic.  Eyes: Conjunctivae and EOM are normal.  Neck: Neck supple. No tracheal deviation present.  Cardiovascular: Normal rate.   Pulmonary/Chest: Effort normal. No respiratory distress.  Abdominal: Soft. Bowel sounds are normal. She exhibits no mass. There is tenderness.  Cramping bilateral upper abdominal discomfort which radiates into flanks. No masses. Bowel sounds are  present.  Musculoskeletal: Normal range of motion.  Neurological: She is alert and oriented to person, place, and time.  Skin: Skin is warm and dry.  Psychiatric: She has a normal mood and affect. Her behavior is normal.  Nursing note and vitals reviewed.   ED Treatments / Results  Labs (all labs ordered are listed, but only abnormal results are displayed) Labs Reviewed  CBC WITH DIFFERENTIAL/PLATELET - Abnormal; Notable for the following:       Result Value   Hemoglobin 11.0 (*)    HCT 34.4 (*)    RDW 16.5 (*)    All other components within normal limits  COMPREHENSIVE METABOLIC PANEL - Abnormal; Notable for the following:    Total Bilirubin 0.2 (*)    All other components within normal limits  LIPASE, BLOOD  URINALYSIS, ROUTINE W REFLEX MICROSCOPIC    EKG  EKG Interpretation None       Radiology Dg Abdomen Acute W/chest  Result Date: 04/20/2016 CLINICAL DATA:  Epigastric pain x3 days with nausea, vomiting, diarrhea for 2 days. EXAM: DG ABDOMEN ACUTE W/ 1V CHEST COMPARISON:  None. FINDINGS: There is no evidence of dilated bowel loops or free intraperitoneal air. Surgical clips in chain sutures are noted in the right upper quadrant, epigastric region as well as left lower quadrant. Phleboliths are seen in the pelvis bilaterally. Heart size and mediastinal contours are within normal limits. Both lungs are clear. No acute nor suspicious osseous abnormalities. IMPRESSION: No free air or bowel obstruction. No organomegaly. No acute cardiopulmonary disease. Electronically Signed   By: Tollie Eth M.D.   On: 04/20/2016 22:39    Procedures Procedures (including critical care time)  DIAGNOSTIC STUDIES: Oxygen Saturation is 100% on RA, normal by my interpretation.    COORDINATION OF CARE: 10:08 PM Discussed treatment plan with pt at bedside and pt agreed to plan.  Medications Ordered in ED Medications  sodium chloride 0.9 % bolus 1,000 mL (0 mLs Intravenous Stopped 04/20/16 2227)   sodium chloride 0.9 % bolus 1,000 mL (0 mLs Intravenous Stopped 04/20/16 2301)     Initial Impression / Assessment and Plan / ED Course  I have reviewed the triage vital signs and the nursing notes.  Pertinent labs & imaging results that were available during my care of the patient were reviewed by me and considered in my medical decision making (see chart for details).     Patient with symptoms consistent with viral gastroenteritis.  Vitals are stable, no fever.  No signs of dehydration, tolerating PO fluids > 6 oz.  Lungs are clear.  No focal abdominal pain, no concern for appendicitis, cholecystitis, pancreatitis, ruptured viscus, UTI, kidney stone, or any other abdominal etiology.  Supportive therapy indicated with return if symptoms worsen.  Patient counseled.  Final Clinical Impressions(s) / ED Diagnoses   Final diagnoses:  Viral gastroenteritis    New Prescriptions Discharge Medication List as of 04/20/2016 10:55 PM    START taking these medications   Details  ondansetron (ZOFRAN ODT) 4 MG disintegrating tablet 4mg  ODT q6 hours prn nausea/vomit,  Print       I personally performed the services described in this documentation, which was scribed in my presence. The recorded information has been reviewed and is accurate.    Bethany Morn, NP 04/20/16 1610    Lyndal Pulley, MD 04/21/16 [phone]

## 2016-04-20 NOTE — ED Triage Notes (Signed)
Patient states that she has had N/V/D x 2 days.

## 2017-07-22 ENCOUNTER — Encounter (HOSPITAL_BASED_OUTPATIENT_CLINIC_OR_DEPARTMENT_OTHER): Payer: Self-pay | Admitting: Emergency Medicine

## 2017-07-22 ENCOUNTER — Emergency Department (HOSPITAL_BASED_OUTPATIENT_CLINIC_OR_DEPARTMENT_OTHER)
Admission: EM | Admit: 2017-07-22 | Discharge: 2017-07-22 | Disposition: A | Discharge status: Home / Self Care | Payer: BLUE CROSS/BLUE SHIELD | Attending: Emergency Medicine | Admitting: Emergency Medicine

## 2017-07-22 ENCOUNTER — Other Ambulatory Visit: Payer: Self-pay

## 2017-07-22 DIAGNOSIS — R51 Headache: Secondary | ICD-10-CM | POA: Diagnosis present

## 2017-07-22 DIAGNOSIS — J3489 Other specified disorders of nose and nasal sinuses: Secondary | ICD-10-CM | POA: Diagnosis not present

## 2017-07-22 DIAGNOSIS — H5789 Other specified disorders of eye and adnexa: Secondary | ICD-10-CM | POA: Insufficient documentation

## 2017-07-22 DIAGNOSIS — R519 Headache, unspecified: Secondary | ICD-10-CM

## 2017-07-22 DIAGNOSIS — H538 Other visual disturbances: Secondary | ICD-10-CM | POA: Diagnosis not present

## 2017-07-22 DIAGNOSIS — Z79899 Other long term (current) drug therapy: Secondary | ICD-10-CM | POA: Insufficient documentation

## 2017-07-22 MED ORDER — METOCLOPRAMIDE HCL 5 MG/ML IJ SOLN
10.0000 mg | Freq: Once | INTRAMUSCULAR | Status: AC
  Administered 2017-07-22: 10 mg via INTRAVENOUS
  Filled 2017-07-22: qty 2

## 2017-07-22 MED ORDER — SODIUM CHLORIDE 0.9 % IV BOLUS
500.0000 mL | Freq: Once | INTRAVENOUS | Status: AC
  Administered 2017-07-22: 500 mL via INTRAVENOUS

## 2017-07-22 MED ORDER — TETRACAINE HCL 0.5 % OP SOLN
2.0000 [drp] | Freq: Once | OPHTHALMIC | Status: DC
  Filled 2017-07-22: qty 4

## 2017-07-22 MED ORDER — DIPHENHYDRAMINE HCL 50 MG/ML IJ SOLN
25.0000 mg | Freq: Once | INTRAMUSCULAR | Status: AC
  Administered 2017-07-22: 25 mg via INTRAVENOUS
  Filled 2017-07-22: qty 1

## 2017-07-22 NOTE — ED Notes (Signed)
ED Provider at bedside. 

## 2017-07-22 NOTE — Discharge Instructions (Signed)
Please read and follow all provided instructions.  Your diagnoses today include:  1. Acute nonintractable headache, unspecified headache type     Tests performed today include:  Vital signs. See below for your results today.   Medications:  In the Emergency Department you received:  Reglan - antinausea/headache medication  Benadryl - antihistamine to counteract potential side effects of reglan  Take any prescribed medications only as directed.  Additional information:  Follow any educational materials contained in this packet.  You are having a headache. No specific cause was found today for your headache. It may have been a migraine or other cause of headache. Stress, anxiety, fatigue, and depression are common triggers for headaches.   Your headache today does not appear to be life-threatening or require hospitalization, but often the exact cause of headaches is not determined in the emergency department. Therefore, follow-up with your doctor is very important to find out what may have caused your headache and whether or not you need any further diagnostic testing or treatment.   Sometimes headaches can appear benign (not harmful), but then more serious symptoms can develop which should prompt an immediate re-evaluation by your doctor or the emergency department.  BE VERY CAREFUL not to take multiple medicines containing Tylenol (also called acetaminophen). Doing so can lead to an overdose which can damage your liver and cause liver failure and possibly death.   Follow-up instructions: Please follow-up with your primary care provider in the next 3 days for further evaluation of your symptoms.   Return instructions:   Please return to the Emergency Department if you experience worsening symptoms.  Return if the medications do not resolve your headache, if it recurs, or if you have multiple episodes of vomiting or cannot keep down fluids.  Return if you have a change from the  usual headache.  RETURN IMMEDIATELY IF you:  Develop a sudden, severe headache  Develop confusion or become poorly responsive or faint  Develop a fever above 100.83F or problem breathing  Have a change in speech, vision, swallowing, or understanding  Develop new weakness, numbness, tingling, incoordination in your arms or legs  Have a seizure  Please return if you have any other emergent concerns.  Additional Information:  Your vital signs today were: BP 121/81 (BP Location: Left Arm)    Pulse 68    Temp 98.7 F (37.1 C) (Oral)    Resp 18    Ht 5\' 3"  (1.6 m)    Wt 77.1 kg (170 lb)    SpO2 98%    BMI 30.11 kg/m  If your blood pressure (BP) was elevated above 135/85 this visit, please have this repeated by your doctor within one month. --------------

## 2017-07-22 NOTE — ED Notes (Signed)
Pt put on 15L O2 nonrebreather mask for per EDP- to evaluate HA afterwards.

## 2017-07-22 NOTE — ED Triage Notes (Signed)
C/o L sided facial pain since yesterday. States she has been taking Benadryl without relief. Also reports "sweating", nausea and decreased appetite.

## 2017-07-22 NOTE — ED Notes (Signed)
Pt reports taking benadryl x 3 days with no relief.

## 2017-07-22 NOTE — ED Provider Notes (Signed)
[facility] EMERGENCY DEPARTMENT Provider Note   CSN: [id number] Arrival date & time: 07/22/17  1904     History   Chief Complaint Chief Complaint  Patient presents with  . Facial Pain    HPI Bethany Walker is a 52 y.o. female.  Patient presents the emergency department with complaint of headache.  Headache is described as an ice pick behind her left eye.  Symptoms started about 3 days ago.  Patient states that symptoms have been asked waxing and waning but have been severe over the past hour.  She has had associated tearing of the eye.  She has some mild rhinorrhea at times.  She states that her vision is blurry but denies vision loss.  No fevers, neck pain, nausea or vomiting.  Patient states that she had similar headaches in the remote past for which she had a lumbar puncture.  She states she was told it was her sinuses at that time.  Symptoms then resolved.  She has taken over-the-counter medications without improvement.  She states that she thought she had a sinus infection and has been in bed for a couple days.  Today she went to work and the symptoms seem to be worse with standing.  She has associated photophobia.     Past Medical History:  Diagnosis Date  . Ulcer of bile duct 1990    There are no active problems to display for this patient.   Past Surgical History:  Procedure Laterality Date  . BILIARY TUBE PLACEMENT ([facility] HX)  2012  . TONSILLECTOMY    . TUBAL LIGATION  1990     OB History   None      Home Medications    Prior to Admission medications   Medication Sig Start Date End Date Taking? Authorizing Provider  famotidine (PEPCID) 20 MG tablet Take 1 tablet (20 mg total) by mouth daily. 05/20/15  Yes Horton, Mayer Masker, MD  ibuprofen (ADVIL,MOTRIN) 600 MG tablet Take 1 tablet (600 mg total) by mouth every 8 (eight) hours as needed (for fever or body aches). 12/03/15  Yes Molpus, John, MD  ondansetron (ZOFRAN ODT) 4 MG disintegrating tablet 4mg   ODT q6 hours prn nausea/vomit 04/20/16  Yes Felicie Morn, NP  diphenhydrAMINE (BENADRYL) 25 mg capsule Take 1 capsule (25 mg total) by mouth every 8 (eight) hours as needed. 05/20/15   Horton, Mayer Masker, MD    Family History No family history on file.  Social History Social History   Tobacco Use  . Smoking status: Never Smoker  . Smokeless tobacco: Never Used  Substance Use Topics  . Alcohol use: No  . Drug use: No     Allergies   Dilaudid [hydromorphone hcl]   Review of Systems Review of Systems  Constitutional: Negative for fever.  HENT: Positive for rhinorrhea. Negative for congestion, dental problem and sinus pressure.   Eyes: Positive for photophobia, discharge and visual disturbance. Negative for redness.  Respiratory: Negative for shortness of breath.   Cardiovascular: Negative for chest pain.  Gastrointestinal: Negative for nausea and vomiting.  Musculoskeletal: Negative for gait problem, neck pain and neck stiffness.  Skin: Negative for rash.  Neurological: Positive for headaches. Negative for syncope, speech difficulty, weakness, light-headedness and numbness.  Psychiatric/Behavioral: Negative for confusion.     Physical Exam Updated Vital Signs BP 130/88 (BP Location: Left Arm)   Pulse 98   Temp 98.7 F (37.1 C) (Oral)   Resp 18   Ht 5\' 3"  (1.6 m)  Wt 77.1 kg (170 lb)   SpO2 99%   BMI 30.11 kg/m   Physical Exam  Constitutional: She is oriented to person, place, and time. She appears well-developed and well-nourished.  HENT:  Head: Normocephalic and atraumatic. Head is without raccoon's eyes and without Battle's sign.  Right Ear: Tympanic membrane, external ear and ear canal normal. No hemotympanum.  Left Ear: Tympanic membrane, external ear and ear canal normal. No hemotympanum.  Nose: Nose normal. No nasal septal hematoma.  Mouth/Throat: Uvula is midline, oropharynx is clear and moist and mucous membranes are normal.  Eyes: Pupils are equal, round,  and reactive to light. Conjunctivae, EOM and lids are normal. Right conjunctiva is not injected. Left conjunctiva is not injected. Right eye exhibits no nystagmus. Left eye exhibits no nystagmus.  No visible hyphema noted  Neck: Normal range of motion. Neck supple.  Cardiovascular: Normal rate and regular rhythm.  Pulmonary/Chest: Effort normal and breath sounds normal.  Abdominal: Soft. There is no tenderness.  Musculoskeletal:       Cervical back: She exhibits normal range of motion, no tenderness and no bony tenderness.       Thoracic back: She exhibits no tenderness and no bony tenderness.       Lumbar back: She exhibits no tenderness and no bony tenderness.  Neurological: She is alert and oriented to person, place, and time. She has normal strength and normal reflexes. No cranial nerve deficit or sensory deficit. Coordination normal. GCS eye subscore is 4. GCS verbal subscore is 5. GCS motor subscore is 6.  Skin: Skin is warm and dry.  Psychiatric: She has a normal mood and affect.  Nursing note and vitals reviewed.    ED Treatments / Results  Labs (all labs ordered are listed, but only abnormal results are displayed) Labs Reviewed - No data to display  EKG None  Radiology No results found.  Procedures Procedures (including critical care time)  Medications Ordered in ED Medications  metoCLOPramide (REGLAN) injection 10 mg (10 mg Intravenous Given 07/22/17 2020)  diphenhydrAMINE (BENADRYL) injection 25 mg (25 mg Intravenous Given 07/22/17 2021)  sodium chloride 0.9 % bolus 500 mL (500 mLs Intravenous New Bag/Given 07/22/17 2022)  tetracaine (PONTOCAINE) 0.5 % ophthalmic solution 2 drop (2 drops Left Eye Given 07/22/17 2022)     Initial Impression / Assessment and Plan / ED Course  I have reviewed the triage vital signs and the nursing notes.  Pertinent labs & imaging results that were available during my care of the patient were reviewed by me and considered in my medical  decision making (see chart for details).     Patient seen and examined. Cluster HA on differential. Patient placed on a trial of high flow O2 per NRB. If this does not help, will place IV and treat with migraine cocktail.   Vital signs reviewed and are as follows: BP 130/88 (BP Location: Left Arm)   Pulse 98   Temp 98.7 F (37.1 C) (Oral)   Resp 18   Ht 5\' 3"  (1.6 m)   Wt 77.1 kg (170 lb)   SpO2 99%   BMI 30.11 kg/m   8:00 PM Patient had improvement but still with sharp pain behind L eye. Will give migraine cocktail.  9:32 PM symptoms resolved after migraine cocktail.  No residual pain.  Comfortable with discharged home at this time.  Patient encouraged to follow-up with PCP. Referral given.   Final Clinical Impressions(s) / ED Diagnoses   Final diagnoses:  Acute  nonintractable headache, unspecified headache type   Patient with HA -- pattern suggestive of cluster HA, slightly improved with O2, resolved with migraine cocktail. Patient without high-risk features of headache including: sudden onset/thunderclap HA, no similar headache in past, altered mental status, accompanying seizure, headache with exertion, age > 47, history of immunocompromise, neck or shoulder pain, fever, use of anticoagulation, family history of spontaneous SAH, concomitant drug use, toxic exposure.   Patient has a normal complete neurological exam, normal vital signs, normal level of consciousness, no signs of meningismus, is well-appearing/non-toxic appearing, no signs of trauma.   Imaging with CT/MRI not indicated given history and physical exam findings.   No dangerous or life-threatening conditions suspected or identified by history, physical exam, and by work-up. No indications for hospitalization identified.    ED Discharge Orders    None       Renne Crigler, Cordelia Poche 07/22/17 2134    Derwood Kaplan, MD 07/22/17 [phone]

## 2017-12-22 ENCOUNTER — Emergency Department (HOSPITAL_BASED_OUTPATIENT_CLINIC_OR_DEPARTMENT_OTHER)
Admission: EM | Admit: 2017-12-22 | Discharge: 2017-12-22 | Disposition: A | Discharge status: Home / Self Care | Payer: BLUE CROSS/BLUE SHIELD | Attending: Emergency Medicine | Admitting: Emergency Medicine

## 2017-12-22 ENCOUNTER — Other Ambulatory Visit: Payer: Self-pay

## 2017-12-22 ENCOUNTER — Encounter (HOSPITAL_BASED_OUTPATIENT_CLINIC_OR_DEPARTMENT_OTHER): Payer: Self-pay | Admitting: *Deleted

## 2017-12-22 ENCOUNTER — Emergency Department (HOSPITAL_BASED_OUTPATIENT_CLINIC_OR_DEPARTMENT_OTHER): Payer: BLUE CROSS/BLUE SHIELD

## 2017-12-22 DIAGNOSIS — J069 Acute upper respiratory infection, unspecified: Secondary | ICD-10-CM | POA: Insufficient documentation

## 2017-12-22 DIAGNOSIS — B9789 Other viral agents as the cause of diseases classified elsewhere: Secondary | ICD-10-CM

## 2017-12-22 DIAGNOSIS — Z79899 Other long term (current) drug therapy: Secondary | ICD-10-CM | POA: Insufficient documentation

## 2017-12-22 DIAGNOSIS — R05 Cough: Secondary | ICD-10-CM | POA: Diagnosis present

## 2017-12-22 NOTE — ED Notes (Signed)
Pt verbalized understanding of dc instructions.

## 2017-12-22 NOTE — ED Notes (Signed)
Pt states she has been coughing for 2 weeks. She states she has a history of allergies but does not take medication on a regular basis. She took benadryl yesterday and claritin last week. N medications taken today

## 2017-12-22 NOTE — ED Provider Notes (Signed)
[facility] EMERGENCY DEPARTMENT Provider Note   CSN: [id number] Arrival date & time: 12/22/17  1717     History   Chief Complaint Chief Complaint  Patient presents with  . Cough    HPI Bethany Walker is a 52 y.o. female.  Bethany Walker is a 52 y.o. Female who is otherwise healthy, presents to the emergency department for evaluation of cough.  She reports for the past 2 weeks she has been having persistent cough, intermittently it is been productive of thick mucus.  She reports she has had a lot of nasal congestion and postnasal drainage and some intermittent sneezing.  She reports the back of her throat has felt very scratchy, but not sore.  She reports low-grade fever of 100 at home a few days ago but no other fevers or chills.  She reports her chest feels sore from coughing but no chest pain or shortness of breath.  No abdominal pain, nausea or vomiting.  Patient does have a history of seasonal allergies but does not take anything regularly.  She denies any known sick contacts.  Symptoms have been waxing and waning in intensity since onset she has been taking over-the-counter medications without improvement, no other aggravating or alleviating factors.     Past Medical History:  Diagnosis Date  . Ulcer of bile duct 1990    There are no active problems to display for this patient.   Past Surgical History:  Procedure Laterality Date  . BILIARY TUBE PLACEMENT ([facility] HX)  2012  . TONSILLECTOMY    . TUBAL LIGATION  1990     OB History   None      Home Medications    Prior to Admission medications   Medication Sig Start Date End Date Taking? Authorizing Provider  diphenhydrAMINE (BENADRYL) 25 mg capsule Take 1 capsule (25 mg total) by mouth every 8 (eight) hours as needed. 05/20/15   Horton, Mayer Masker, MD  famotidine (PEPCID) 20 MG tablet Take 1 tablet (20 mg total) by mouth daily. 05/20/15   Horton, Mayer Masker, MD  ibuprofen (ADVIL,MOTRIN) 600 MG tablet Take 1  tablet (600 mg total) by mouth every 8 (eight) hours as needed (for fever or body aches). 12/03/15   Molpus, John, MD  ondansetron (ZOFRAN ODT) 4 MG disintegrating tablet 4mg  ODT q6 hours prn nausea/vomit 04/20/16   Felicie Morn, NP    Family History No family history on file.  Social History Social History   Tobacco Use  . Smoking status: Never Smoker  . Smokeless tobacco: Never Used  Substance Use Topics  . Alcohol use: No  . Drug use: No     Allergies   Dilaudid [hydromorphone hcl]   Review of Systems Review of Systems  Constitutional: Negative for chills and fever.  HENT: Positive for congestion, postnasal drip, sneezing and sore throat. Negative for rhinorrhea.   Eyes: Negative for visual disturbance.  Respiratory: Positive for cough. Negative for chest tightness, shortness of breath and wheezing.   Gastrointestinal: Negative for abdominal pain, nausea and vomiting.  Genitourinary: Negative for dysuria and frequency.  Musculoskeletal: Negative for arthralgias and myalgias.  Skin: Negative for color change and rash.  Neurological: Negative for dizziness, syncope and headaches.     Physical Exam Updated Vital Signs BP (!) 149/85 (BP Location: Left Arm)   Pulse 72   Temp 98.3 F (36.8 C) (Oral)   Resp 18   Ht 5\' 3"  (1.6 m)   Wt 81.6 kg   SpO2 100%  BMI 31.89 kg/m   Physical Exam  Constitutional: She appears well-developed and well-nourished. She does not appear ill. No distress.  HENT:  Head: Normocephalic and atraumatic.  Mouth/Throat: Oropharynx is clear and moist.  TMs clear with good landmarks, moderate nasal mucosa edema with clear rhinorrhea, posterior oropharynx clear and moist, with some erythema, no edema or exudates, uvula midline  Eyes: Right eye exhibits no discharge. Left eye exhibits no discharge.  Neck: Neck supple.  No rigidity  Cardiovascular: Normal rate, regular rhythm, normal heart sounds and intact distal pulses.  Pulmonary/Chest:  Effort normal and breath sounds normal. No respiratory distress.  Respirations equal and unlabored, patient able to speak in full sentences, lungs clear to auscultation bilaterally, occasional cough during exam  Abdominal: Soft. Bowel sounds are normal. She exhibits no distension and no mass. There is no tenderness. There is no guarding.  Abdomen soft, nondistended, nontender to palpation in all quadrants without guarding or peritoneal signs  Musculoskeletal: She exhibits no deformity.  Lymphadenopathy:    She has no cervical adenopathy.  Neurological: She is alert.  Skin: Skin is warm and dry. Capillary refill takes less than 2 seconds. She is not diaphoretic.  Psychiatric: She has a normal mood and affect. Her behavior is normal.  Nursing note and vitals reviewed.    ED Treatments / Results  Labs (all labs ordered are listed, but only abnormal results are displayed) Labs Reviewed - No data to display  EKG None  Radiology No results found.  Procedures Procedures (including critical care time)  Medications Ordered in ED Medications - No data to display   Initial Impression / Assessment and Plan / ED Course  I have reviewed the triage vital signs and the nursing notes.  Pertinent labs & imaging results that were available during my care of the patient were reviewed by me and considered in my medical decision making (see chart for details).  Pt presents with nasal congestion and cough. Pt is well appearing and vitals are normal. Lungs CTA on exam. Pt CXR negative for acute infiltrate. Patients symptoms are consistent with viral URI versus allergic rhinitis. Discussed that antibiotics are not indicated for viral infections. Pt will be discharged with symptomatic treatment.  Verbalizes understanding and is agreeable with plan. Pt is hemodynamically stable & in NAD prior to dc.   Final Clinical Impressions(s) / ED Diagnoses   Final diagnoses:  Viral URI with cough    ED  Discharge Orders    None       Legrand Rams 12/22/17 Ardyth Harps, MD 12/22/17 [phone]

## 2017-12-22 NOTE — ED Triage Notes (Signed)
Cough x 2 weeks with clear mucus. Her chest is sore.

## 2017-12-22 NOTE — ED Notes (Signed)
Pt not in room for assessment.

## 2017-12-22 NOTE — Discharge Instructions (Signed)
Your symptoms are likely caused by a viral upper respiratory infection or allergies. Antibiotics are not helpful in treating viral infection. Please make sure you are drinking plenty of fluids. You can treat your symptoms supportively with tylenol/ibuprofen for fevers and pains, Zyrtec and Flonase to help with nasal congestion, and over the counter cough syrups and throat lozenges to help with cough. If your symptoms are not improving please follow up with you Primary doctor.   If you develop persistent fevers, shortness of breath or difficulty breathing, chest pain, severe headache and neck pain, persistent nausea and vomiting or other new or concerning symptoms return to the Emergency department.

## 2017-12-22 NOTE — ED Notes (Signed)
Patient transported to CT 

## 2017-12-22 NOTE — ED Notes (Signed)
ED Provider at bedside. 

## 2018-01-27 ENCOUNTER — Other Ambulatory Visit: Payer: Self-pay

## 2018-01-27 ENCOUNTER — Emergency Department (HOSPITAL_BASED_OUTPATIENT_CLINIC_OR_DEPARTMENT_OTHER)
Admission: EM | Admit: 2018-01-27 | Discharge: 2018-01-27 | Disposition: A | Discharge status: Home / Self Care | Payer: BLUE CROSS/BLUE SHIELD | Attending: Emergency Medicine | Admitting: Emergency Medicine

## 2018-01-27 ENCOUNTER — Encounter (HOSPITAL_BASED_OUTPATIENT_CLINIC_OR_DEPARTMENT_OTHER): Payer: Self-pay | Admitting: *Deleted

## 2018-01-27 ENCOUNTER — Emergency Department (HOSPITAL_BASED_OUTPATIENT_CLINIC_OR_DEPARTMENT_OTHER): Payer: BLUE CROSS/BLUE SHIELD

## 2018-01-27 DIAGNOSIS — Z79899 Other long term (current) drug therapy: Secondary | ICD-10-CM | POA: Diagnosis not present

## 2018-01-27 DIAGNOSIS — J019 Acute sinusitis, unspecified: Secondary | ICD-10-CM

## 2018-01-27 DIAGNOSIS — R05 Cough: Secondary | ICD-10-CM | POA: Diagnosis present

## 2018-01-27 DIAGNOSIS — R059 Cough, unspecified: Secondary | ICD-10-CM

## 2018-01-27 LAB — CBC WITH DIFFERENTIAL/PLATELET
Abs Immature Granulocytes: 0.02 10*3/uL (ref 0.00–0.07)
Basophils Absolute: 0 10*3/uL (ref 0.0–0.1)
Basophils Relative: 1 %
Eosinophils Absolute: 0.2 10*3/uL (ref 0.0–0.5)
Eosinophils Relative: 3 %
HCT: 43.3 % (ref 36.0–46.0)
HEMOGLOBIN: 13.5 g/dL (ref 12.0–15.0)
Immature Granulocytes: 0 %
Lymphocytes Relative: 39 %
Lymphs Abs: 3.1 10*3/uL (ref 0.7–4.0)
MCH: 29.6 pg (ref 26.0–34.0)
MCHC: 31.2 g/dL (ref 30.0–36.0)
MCV: 95 fL (ref 80.0–100.0)
MONO ABS: 0.4 10*3/uL (ref 0.1–1.0)
Monocytes Relative: 5 %
Neutro Abs: 4.1 10*3/uL (ref 1.7–7.7)
Neutrophils Relative %: 52 %
Platelets: 317 10*3/uL (ref 150–400)
RBC Morphology: NORMAL
RBC: 4.56 MIL/uL (ref 3.87–5.11)
RDW: 12.8 % (ref 11.5–15.5)
Smear Review: NORMAL
WBC: 8 10*3/uL (ref 4.0–10.5)
nRBC: 0 % (ref 0.0–0.2)

## 2018-01-27 LAB — BASIC METABOLIC PANEL
Anion gap: 8 (ref 5–15)
BUN: 13 mg/dL (ref 6–20)
CO2: 23 mmol/L (ref 22–32)
Calcium: 9.6 mg/dL (ref 8.9–10.3)
Chloride: 105 mmol/L (ref 98–111)
Creatinine, Ser: 0.76 mg/dL (ref 0.44–1.00)
GFR calc Af Amer: >60 mL/min (ref >60)
GFR calc non Af Amer: >60 mL/min (ref >60)
Glucose, Bld: 143 mg/dL — ABNORMAL HIGH (ref 70–99)
Potassium: 4.2 mmol/L (ref 3.5–5.1)
Sodium: 136 mmol/L (ref 135–145)

## 2018-01-27 LAB — TROPONIN I: Troponin I: <0.03 ng/mL (ref <0.03)

## 2018-01-27 MED ORDER — IPRATROPIUM-ALBUTEROL 0.5-2.5 (3) MG/3ML IN SOLN
3.0000 mL | Freq: Once | RESPIRATORY_TRACT | Status: AC
  Administered 2018-01-27: 3 mL via RESPIRATORY_TRACT
  Filled 2018-01-27: qty 3

## 2018-01-27 MED ORDER — DOXYCYCLINE HYCLATE 100 MG PO CAPS: 100.0000 mg | ORAL_CAPSULE | Freq: Two times a day (BID) | ORAL | 0 refills | Status: AC

## 2018-01-27 MED ORDER — PREDNISONE 20 MG PO TABS: 40.0000 mg | ORAL_TABLET | Freq: Every day | ORAL | 0 refills | Status: AC

## 2018-01-27 MED ORDER — ALBUTEROL SULFATE HFA 108 (90 BASE) MCG/ACT IN AERS
2.0000 | INHALATION_SPRAY | Freq: Once | RESPIRATORY_TRACT | Status: AC
  Administered 2018-01-27: 2 via RESPIRATORY_TRACT
  Filled 2018-01-27: qty 6.7

## 2018-01-27 MED ORDER — DOXYCYCLINE HYCLATE 100 MG PO TABS
100.0000 mg | ORAL_TABLET | Freq: Once | ORAL | Status: AC
  Administered 2018-01-27: 100 mg via ORAL
  Filled 2018-01-27: qty 1

## 2018-01-27 MED ORDER — PREDNISONE 50 MG PO TABS
60.0000 mg | ORAL_TABLET | Freq: Once | ORAL | Status: AC
  Administered 2018-01-27: 60 mg via ORAL
  Filled 2018-01-27: qty 1

## 2018-01-27 NOTE — ED Triage Notes (Signed)
Cough x 10 days productive for mucous. Pt also c/o feeling lightheaded and having sweats

## 2018-01-27 NOTE — ED Notes (Signed)
Patient transported to X-ray 

## 2018-01-27 NOTE — Discharge Instructions (Addendum)
To use your inhaler please take 2 puffs every 4 hours as needed for wheezing or shortness of breath.  Please take Ibuprofen (Advil, motrin) and Tylenol (acetaminophen) to relieve your pain.  You may take up to 600 MG (3 pills) of normal strength ibuprofen every 8 hours as needed.  In between doses of ibuprofen you make take tylenol, up to 1,000 mg (two extra strength pills).  Do not take more than 3,000 mg tylenol in a 24 hour period.  Please check all medication labels as many medications such as pain and cold medications may contain tylenol.  Do not drink alcohol while taking these medications.  Do not take other NSAID'S while taking ibuprofen (such as aleve or naproxen).  Please take ibuprofen with food to decrease stomach upset.  You may have diarrhea from the antibiotics.  It is very important that you continue to take the antibiotics even if you get diarrhea unless a medical professional tells you that you may stop taking them.  If you stop too early the bacteria you are being treated for will become stronger and you may need different, more powerful antibiotics that have more side effects and worsening diarrhea.  Please stay well hydrated and consider probiotics as they may decrease the severity of your diarrhea.  Please be aware that if you take any hormonal contraception (birth control pills, nexplanon, the ring, etc) that your birth control will not work while you are taking antibiotics and you need to use back up protection as directed on the birth control medication information insert.   I have given you a prescription for steroids today.  Some common side effects include feelings of extra energy, feeling warm, increased appetite, and stomach upset.  If you are diabetic your sugars may run higher than usual.

## 2018-01-27 NOTE — ED Provider Notes (Signed)
[facility] EMERGENCY DEPARTMENT Provider Note   CSN: [id number] Arrival date & time: 01/27/18  1900     History   Chief Complaint Chief Complaint  Patient presents with  . Cough    HPI Bethany Walker is a 52 y.o. female with a past medical history of bilateral tubal ligation who presents today for evaluation of cough and cold-like symptoms for approximately 10 days.  She reports that initially her symptoms started out as nasal congestion and sinus pressure however has now settled down into her chest with cough.  She has been trying OTC cough and cold medicines without significant relief.  She reports that occasionally she gets lightheaded or dizzy.  She has not previously used an inhaler.  She denies any history of smoking or secondhand smoke exposure.    HPI  Past Medical History:  Diagnosis Date  . Ulcer of bile duct 1990    There are no active problems to display for this patient.   Past Surgical History:  Procedure Laterality Date  . BILIARY TUBE PLACEMENT ([facility] HX)  2012  . TONSILLECTOMY    . TUBAL LIGATION  1990     OB History   No obstetric history on file.      Home Medications    Prior to Admission medications   Medication Sig Start Date End Date Taking? Authorizing Provider  diphenhydrAMINE (BENADRYL) 25 mg capsule Take 1 capsule (25 mg total) by mouth every 8 (eight) hours as needed. 05/20/15   Horton, Mayer Maskerourtney F, MD  doxycycline (VIBRAMYCIN) 100 MG capsule Take 1 capsule (100 mg total) by mouth 2 (two) times daily for 7 days. 01/27/18 02/03/18  Cristina GongHammond, Gurpreet Mikhail W, PA-C  famotidine (PEPCID) 20 MG tablet Take 1 tablet (20 mg total) by mouth daily. 05/20/15   Horton, Mayer Maskerourtney F, MD  ibuprofen (ADVIL,MOTRIN) 600 MG tablet Take 1 tablet (600 mg total) by mouth every 8 (eight) hours as needed (for fever or body aches). 12/03/15   Molpus, John, MD  ondansetron (ZOFRAN ODT) 4 MG disintegrating tablet 4mg  ODT q6 hours prn nausea/vomit 04/20/16   Felicie MornSmith,  David, NP  predniSONE (DELTASONE) 20 MG tablet Take 2 tablets (40 mg total) by mouth daily for 4 days. 01/27/18 01/31/18  Cristina GongHammond, Marializ Ferrebee W, PA-C    Family History History reviewed. No pertinent family history.  Social History Social History   Tobacco Use  . Smoking status: Never Smoker  . Smokeless tobacco: Never Used  Substance Use Topics  . Alcohol use: No  . Drug use: No     Allergies   Dilaudid [hydromorphone hcl] and Penicillins   Review of Systems Review of Systems  Constitutional: Negative for chills and fever.  HENT: Positive for congestion, ear pain, postnasal drip, rhinorrhea and sore throat. Negative for ear discharge, facial swelling, sinus pressure, sinus pain, trouble swallowing and voice change.   Respiratory: Positive for cough. Negative for chest tightness, shortness of breath and wheezing.   Cardiovascular: Negative for chest pain and palpitations.  Gastrointestinal: Negative for anal bleeding, nausea and vomiting.  Musculoskeletal: Negative for neck pain and neck stiffness.  Neurological: Positive for light-headedness. Negative for headaches.  All other systems reviewed and are negative.    Physical Exam Updated Vital Signs BP 109/84 (BP Location: Right Arm)   Pulse 71   Temp 98.5 F (36.9 C) (Oral)   Resp 19   Ht 5\' 3"  (1.6 m)   Wt 79.4 kg   SpO2 100%   BMI 31.00 kg/m  Physical Exam Vitals signs and nursing note reviewed.  Constitutional:      General: She is not in acute distress.    Appearance: Normal appearance. She is well-developed. She is not ill-appearing or diaphoretic.  HENT:     Head: Normocephalic and atraumatic.     Right Ear: Tympanic membrane, ear canal and external ear normal.     Left Ear: Tympanic membrane, ear canal and external ear normal.     Nose: Mucosal edema, congestion and rhinorrhea present.     Mouth/Throat:     Mouth: Mucous membranes are moist.     Pharynx: Uvula midline. No oropharyngeal exudate.      Tonsils: No tonsillar exudate.  Eyes:     General: No scleral icterus.    Conjunctiva/sclera: Conjunctivae normal.  Neck:     Musculoskeletal: Normal range of motion and neck supple. No neck rigidity.  Cardiovascular:     Rate and Rhythm: Normal rate and regular rhythm.     Pulses: Normal pulses.     Heart sounds: Normal heart sounds. No murmur.  Pulmonary:     Effort: Pulmonary effort is normal. No respiratory distress.     Breath sounds: Normal breath sounds. No wheezing.  Abdominal:     General: Abdomen is flat. Bowel sounds are normal.     Palpations: Abdomen is soft.     Tenderness: There is no abdominal tenderness.  Musculoskeletal: Normal range of motion.     Right lower leg: No edema.     Left lower leg: No edema.  Lymphadenopathy:     Cervical: No cervical adenopathy.  Skin:    General: Skin is warm and dry.  Neurological:     General: No focal deficit present.     Mental Status: She is alert.  Psychiatric:        Mood and Affect: Mood normal.        Behavior: Behavior normal.      ED Treatments / Results  Labs (all labs ordered are listed, but only abnormal results are displayed) Labs Reviewed  BASIC METABOLIC PANEL - Abnormal; Notable for the following components:      Result Value   Glucose, Bld 143 (*)    All other components within normal limits  CBC WITH DIFFERENTIAL/PLATELET  TROPONIN I    EKG None  Radiology Dg Chest 2 View  Result Date: 01/27/2018 CLINICAL DATA:  Cough, congestion EXAM: CHEST - 2 VIEW COMPARISON:  12/22/2017 FINDINGS: Heart and mediastinal contours are within normal limits. No focal opacities or effusions. No acute bony abnormality. IMPRESSION: No active cardiopulmonary disease. Electronically Signed   By: Charlett Nose M.D.   On: 01/27/2018 19:48    Procedures Procedures (including critical care time)  Medications Ordered in ED Medications  ipratropium-albuterol (DUONEB) 0.5-2.5 (3) MG/3ML nebulizer solution 3 mL (3 mLs  Nebulization Given 01/27/18 2024)  predniSONE (DELTASONE) tablet 60 mg (60 mg Oral Given 01/27/18 2124)  doxycycline (VIBRA-TABS) tablet 100 mg (100 mg Oral Given 01/27/18 2124)  albuterol (PROVENTIL HFA;VENTOLIN HFA) 108 (90 Base) MCG/ACT inhaler 2 puff (2 puffs Inhalation Given 01/27/18 2145)     Initial Impression / Assessment and Plan / ED Course  I have reviewed the triage vital signs and the nursing notes.  Pertinent labs & imaging results that were available during my care of the patient were reviewed by me and considered in my medical decision making (see chart for details).  Clinical Course as of Jan 28 2147  Sun Jan 27, 2018  2117 In room HR is 62   [EH]    Clinical Course User Index [EH] Cristina Gong, PA-C   Patient complaining of symptoms of sinusitis.    Severe symptoms have been present for greater than 10 days with purulent nasal discharge and maxillary sinus pain.  Concern for acute bacterial rhinosinusitis.  Patient discharged with Doxycycline.  Instructions given for warm saline nasal wash and recommendations for follow-up with primary care physician.    Patient did report mild shortness of breath, therefore labs were obtained.  She does not have a significant leukocytosis, is not anemic.  Her troponin is not elevated.  She does not have a significant electrolyte derangement.  WBC morphology does show toxic granulation, which I suspect is consistent with her bacterial rhinosinusitis.  Her heart rate upon arrival was 102, however did not remain elevated.  If she had a PE I would have suspected that it would have been consistently elevated.  Do not suspect PE or ACS based on time course.  Her cough improved with an albuterol treatment, therefore she is given a albuterol inhaler to go home with.   Final Clinical Impressions(s) / ED Diagnoses   Final diagnoses:  Cough  Acute non-recurrent sinusitis, unspecified location    ED Discharge Orders         Ordered      doxycycline (VIBRAMYCIN) 100 MG capsule  2 times daily     01/27/18 2144    predniSONE (DELTASONE) 20 MG tablet  Daily     01/27/18 2144           Cristina Gong, Cordelia Poche 01/27/18 2149    Alvira Monday, MD 01/29/18 [phone]

## 2019-05-29 ENCOUNTER — Emergency Department (HOSPITAL_BASED_OUTPATIENT_CLINIC_OR_DEPARTMENT_OTHER)
Admission: EM | Admit: 2019-05-29 | Discharge: 2019-05-30 | Disposition: A | Discharge status: Home / Self Care | Payer: BC Managed Care – PPO | Attending: Emergency Medicine | Admitting: Emergency Medicine

## 2019-05-29 ENCOUNTER — Encounter (HOSPITAL_BASED_OUTPATIENT_CLINIC_OR_DEPARTMENT_OTHER): Payer: Self-pay | Admitting: *Deleted

## 2019-05-29 ENCOUNTER — Emergency Department (HOSPITAL_BASED_OUTPATIENT_CLINIC_OR_DEPARTMENT_OTHER): Payer: BC Managed Care – PPO

## 2019-05-29 ENCOUNTER — Other Ambulatory Visit: Payer: Self-pay

## 2019-05-29 DIAGNOSIS — R110 Nausea: Secondary | ICD-10-CM

## 2019-05-29 DIAGNOSIS — K5900 Constipation, unspecified: Secondary | ICD-10-CM | POA: Diagnosis not present

## 2019-05-29 DIAGNOSIS — R197 Diarrhea, unspecified: Secondary | ICD-10-CM | POA: Diagnosis not present

## 2019-05-29 DIAGNOSIS — Z885 Allergy status to narcotic agent status: Secondary | ICD-10-CM | POA: Insufficient documentation

## 2019-05-29 DIAGNOSIS — R42 Dizziness and giddiness: Secondary | ICD-10-CM | POA: Insufficient documentation

## 2019-05-29 DIAGNOSIS — Z880 Allergy status to penicillin: Secondary | ICD-10-CM | POA: Insufficient documentation

## 2019-05-29 DIAGNOSIS — R1084 Generalized abdominal pain: Secondary | ICD-10-CM

## 2019-05-29 DIAGNOSIS — Z79899 Other long term (current) drug therapy: Secondary | ICD-10-CM | POA: Diagnosis not present

## 2019-05-29 LAB — COMPREHENSIVE METABOLIC PANEL
ALT: 16 U/L (ref 0–44)
AST: 21 U/L (ref 15–41)
Albumin: 4.2 g/dL (ref 3.5–5.0)
Alkaline Phosphatase: 106 U/L (ref 38–126)
Anion gap: 8 (ref 5–15)
BUN: 15 mg/dL (ref 6–20)
CO2: 23 mmol/L (ref 22–32)
Calcium: 10 mg/dL (ref 8.9–10.3)
Chloride: 109 mmol/L (ref 98–111)
Creatinine, Ser: 0.63 mg/dL (ref 0.44–1.00)
GFR calc Af Amer: >60 mL/min (ref >60)
GFR calc non Af Amer: >60 mL/min (ref >60)
Glucose, Bld: 90 mg/dL (ref 70–99)
Potassium: 4.2 mmol/L (ref 3.5–5.1)
Sodium: 140 mmol/L (ref 135–145)
Total Bilirubin: 0.3 mg/dL (ref 0.3–1.2)
Total Protein: 8.3 g/dL — ABNORMAL HIGH (ref 6.5–8.1)

## 2019-05-29 LAB — URINALYSIS, ROUTINE W REFLEX MICROSCOPIC
Bilirubin Urine: NEGATIVE
Glucose, UA: NEGATIVE mg/dL
Hgb urine dipstick: NEGATIVE
Ketones, ur: 15 mg/dL — AB
Leukocytes,Ua: NEGATIVE
Nitrite: NEGATIVE
Protein, ur: NEGATIVE mg/dL
Specific Gravity, Urine: >1.03 — ABNORMAL HIGH (ref 1.005–1.030)
pH: 6 (ref 5.0–8.0)

## 2019-05-29 LAB — CBC WITH DIFFERENTIAL/PLATELET
Abs Immature Granulocytes: 0.02 10*3/uL (ref 0.00–0.07)
Basophils Absolute: 0 10*3/uL (ref 0.0–0.1)
Basophils Relative: 0 %
Eosinophils Absolute: 0.2 10*3/uL (ref 0.0–0.5)
Eosinophils Relative: 2 %
HCT: 42.5 % (ref 36.0–46.0)
Hemoglobin: 13.6 g/dL (ref 12.0–15.0)
Immature Granulocytes: 0 %
Lymphocytes Relative: 26 %
Lymphs Abs: 2.3 10*3/uL (ref 0.7–4.0)
MCH: 30.6 pg (ref 26.0–34.0)
MCHC: 32 g/dL (ref 30.0–36.0)
MCV: 95.5 fL (ref 80.0–100.0)
Monocytes Absolute: 0.5 10*3/uL (ref 0.1–1.0)
Monocytes Relative: 5 %
Neutro Abs: 6 10*3/uL (ref 1.7–7.7)
Neutrophils Relative %: 67 %
Platelets: 336 10*3/uL (ref 150–400)
RBC: 4.45 MIL/uL (ref 3.87–5.11)
RDW: 12.9 % (ref 11.5–15.5)
WBC: 9 10*3/uL (ref 4.0–10.5)
nRBC: 0 % (ref 0.0–0.2)

## 2019-05-29 MED ORDER — IOHEXOL 300 MG/ML  SOLN
100.0000 mL | Freq: Once | INTRAMUSCULAR | Status: AC | PRN
  Administered 2019-05-29: 23:00:00 100 mL via INTRAVENOUS

## 2019-05-29 MED ORDER — SODIUM CHLORIDE 0.9 % IV BOLUS
1000.0000 mL | Freq: Once | INTRAVENOUS | Status: AC
  Administered 2019-05-29: 22:00:00 1000 mL via INTRAVENOUS

## 2019-05-29 MED ORDER — ONDANSETRON 4 MG PO TBDP: 4.0000 mg | ORAL_TABLET | Freq: Once | ORAL | Status: DC

## 2019-05-29 MED ORDER — ONDANSETRON HCL 4 MG/2ML IJ SOLN
INTRAMUSCULAR | Status: AC
  Filled 2019-05-29: qty 2

## 2019-05-29 MED ORDER — ONDANSETRON HCL 4 MG/2ML IJ SOLN
4.0000 mg | Freq: Once | INTRAMUSCULAR | Status: AC | PRN
  Administered 2019-05-29: 4 mg via INTRAVENOUS

## 2019-05-29 NOTE — ED Notes (Signed)
Blood draw attempted x 1 without success.  

## 2019-05-29 NOTE — ED Triage Notes (Signed)
Bloated and gas like pain in her abdomen for a week.
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
## 2019-05-30 MED ORDER — ONDANSETRON HCL 4 MG PO TABS: 4.0000 mg | ORAL_TABLET | Freq: Three times a day (TID) | ORAL | 0 refills | Status: AC | PRN

## 2019-05-30 NOTE — Discharge Instructions (Signed)
Per discussion, I am prescribing you Zofran which you can take as needed for nausea. Additionally I recommend beginning to take MiraLAX once to twice daily for your constipation.  Please do not hesitate to return the emergency department any new or worsening symptoms. Please follow-up with your primary care provider regarding this visit.

## 2020-02-05 ENCOUNTER — Encounter (HOSPITAL_BASED_OUTPATIENT_CLINIC_OR_DEPARTMENT_OTHER): Payer: Self-pay | Admitting: *Deleted

## 2020-02-05 ENCOUNTER — Other Ambulatory Visit: Payer: Self-pay

## 2020-02-05 DIAGNOSIS — Y92410 Unspecified street and highway as the place of occurrence of the external cause: Secondary | ICD-10-CM | POA: Insufficient documentation

## 2020-02-05 DIAGNOSIS — R519 Headache, unspecified: Secondary | ICD-10-CM | POA: Insufficient documentation

## 2020-02-05 DIAGNOSIS — S46011A Strain of muscle(s) and tendon(s) of the rotator cuff of right shoulder, initial encounter: Secondary | ICD-10-CM | POA: Insufficient documentation

## 2020-02-05 DIAGNOSIS — M542 Cervicalgia: Secondary | ICD-10-CM | POA: Insufficient documentation

## 2020-02-05 DIAGNOSIS — S4991XA Unspecified injury of right shoulder and upper arm, initial encounter: Secondary | ICD-10-CM | POA: Diagnosis present
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
## 2020-02-06 ENCOUNTER — Emergency Department (HOSPITAL_BASED_OUTPATIENT_CLINIC_OR_DEPARTMENT_OTHER): Payer: BC Managed Care – PPO

## 2020-02-06 ENCOUNTER — Emergency Department (HOSPITAL_BASED_OUTPATIENT_CLINIC_OR_DEPARTMENT_OTHER)
Admission: EM | Admit: 2020-02-06 | Discharge: 2020-02-06 | Disposition: A | Discharge status: Home / Self Care | Payer: BC Managed Care – PPO | Attending: Emergency Medicine | Admitting: Emergency Medicine

## 2020-02-06 DIAGNOSIS — R519 Headache, unspecified: Secondary | ICD-10-CM

## 2020-02-06 DIAGNOSIS — S46911A Strain of unspecified muscle, fascia and tendon at shoulder and upper arm level, right arm, initial encounter: Secondary | ICD-10-CM

## 2020-02-06 MED ORDER — PROCHLORPERAZINE EDISYLATE 10 MG/2ML IJ SOLN
10.0000 mg | Freq: Once | INTRAMUSCULAR | Status: AC
  Administered 2020-02-06: 10 mg via INTRAVENOUS
  Filled 2020-02-06: qty 2

## 2020-02-06 MED ORDER — KETOROLAC TROMETHAMINE 30 MG/ML IJ SOLN
30.0000 mg | Freq: Once | INTRAMUSCULAR | Status: AC
  Administered 2020-02-06: 30 mg via INTRAVENOUS
  Filled 2020-02-06: qty 1

## 2020-02-06 MED ORDER — DIPHENHYDRAMINE HCL 50 MG/ML IJ SOLN
25.0000 mg | Freq: Once | INTRAMUSCULAR | Status: AC
  Administered 2020-02-06: 25 mg via INTRAVENOUS
  Filled 2020-02-06: qty 1

## 2020-02-06 MED ORDER — LACTATED RINGERS IV BOLUS
1000.0000 mL | Freq: Once | INTRAVENOUS | Status: AC
  Administered 2020-02-06: 1000 mL via INTRAVENOUS

## 2020-02-06 MED ORDER — DEXAMETHASONE SODIUM PHOSPHATE 10 MG/ML IJ SOLN
10.0000 mg | Freq: Once | INTRAMUSCULAR | Status: AC
  Administered 2020-02-06: 10 mg via INTRAVENOUS
  Filled 2020-02-06: qty 1

## 2020-02-06 MED ORDER — ORPHENADRINE CITRATE ER 100 MG PO TB12: 100.0000 mg | ORAL_TABLET | Freq: Two times a day (BID) | ORAL | 0 refills | Status: AC
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
## 2020-02-06 NOTE — Discharge Instructions (Addendum)
Apply ice to sore areas for 30 minutes at a time, 4 times a day.  Take ibuprofen and/or acetaminophen as needed for pain.  Return if symptoms or not being adequately controlled at home.

## 2020-02-06 NOTE — ED Notes (Signed)
Patient transported to CT 

## 2022-01-10 ENCOUNTER — Emergency Department (HOSPITAL_BASED_OUTPATIENT_CLINIC_OR_DEPARTMENT_OTHER)
Admission: EM | Admit: 2022-01-10 | Discharge: 2022-01-10 | Disposition: A | Discharge status: Home / Self Care | Payer: BC Managed Care – PPO | Attending: Emergency Medicine | Admitting: Emergency Medicine

## 2022-01-10 ENCOUNTER — Other Ambulatory Visit: Payer: Self-pay

## 2022-01-10 ENCOUNTER — Encounter (HOSPITAL_BASED_OUTPATIENT_CLINIC_OR_DEPARTMENT_OTHER): Payer: Self-pay

## 2022-01-10 DIAGNOSIS — Z1152 Encounter for screening for COVID-19: Secondary | ICD-10-CM | POA: Diagnosis not present

## 2022-01-10 DIAGNOSIS — R0981 Nasal congestion: Secondary | ICD-10-CM | POA: Diagnosis present

## 2022-01-10 DIAGNOSIS — R059 Cough, unspecified: Secondary | ICD-10-CM

## 2022-01-10 DIAGNOSIS — J069 Acute upper respiratory infection, unspecified: Secondary | ICD-10-CM | POA: Diagnosis not present

## 2022-01-10 DIAGNOSIS — R197 Diarrhea, unspecified: Secondary | ICD-10-CM | POA: Diagnosis not present

## 2022-01-10 LAB — RESP PANEL BY RT-PCR (FLU A&B, COVID) ARPGX2
Influenza A by PCR: NEGATIVE
Influenza B by PCR: NEGATIVE
SARS Coronavirus 2 by RT PCR: NEGATIVE

## 2022-01-10 MED ORDER — GUAIFENESIN ER 1200 MG PO TB12: 1.0000 | ORAL_TABLET | Freq: Two times a day (BID) | ORAL | 0 refills | Status: AC

## 2022-01-10 MED ORDER — LORATADINE 10 MG PO TABS: 10.0000 mg | ORAL_TABLET | Freq: Every day | ORAL | 0 refills | Status: AC

## 2022-01-10 MED ORDER — OXYMETAZOLINE HCL 0.05 % NA SOLN: 1.0000 | Freq: Two times a day (BID) | NASAL | 0 refills | Status: AC

## 2022-01-10 NOTE — ED Provider Notes (Signed)
[facility] EMERGENCY DEPARTMENT Provider Note   CSN: [id number] Arrival date & time: 01/10/22  0865     History  Chief Complaint  Patient presents with   Diarrhea   URI    Melena Bethany Walker is a 56 y.o. female presenting to the ED today with 4 days of URI symptoms and mild diarrhea.  Symptoms include congestion, sinus pressure, mild nonproductive cough, runny nose, and sore throat.  Still able to swallow without difficulty, though with some mild discomfort.  Diarrhea described as nonbloody and does not appear black/tarry.  Denies neck stiffness or fevers.  Recent sick contacts at home with similar symptoms.  No Hx of lung disease.  Has tried liquid Tylenol and Zyrtec without relief.  The history is provided by the patient and medical records.  Diarrhea Associated symptoms: URI   URI Presenting symptoms: congestion, rhinorrhea and sore throat        Home Medications Prior to Admission medications   Medication Sig Start Date End Date Taking? Authorizing Provider  Guaifenesin (MUCINEX MAXIMUM STRENGTH) 1200 MG TB12 Take 1 tablet (1,200 mg total) by mouth every 12 (twelve) hours for 14 days. 01/10/22 01/24/22 Yes Cecil Cobbs, PA-C  loratadine (CLARITIN) 10 MG tablet Take 1 tablet (10 mg total) by mouth daily. 01/10/22  Yes Cecil Cobbs, PA-C  oxymetazoline (AFRIN NASAL SPRAY) 0.05 % nasal spray Place 1 spray into both nostrils 2 (two) times daily. 01/10/22  Yes Cecil Cobbs, PA-C  diphenhydrAMINE (BENADRYL) 25 mg capsule Take 1 capsule (25 mg total) by mouth every 8 (eight) hours as needed. 05/20/15   Horton, Mayer Masker, MD  famotidine (PEPCID) 20 MG tablet Take 1 tablet (20 mg total) by mouth daily. 05/20/15   Horton, Mayer Masker, MD  ibuprofen (ADVIL,MOTRIN) 600 MG tablet Take 1 tablet (600 mg total) by mouth every 8 (eight) hours as needed (for fever or body aches). 12/03/15   Molpus, John, MD  ondansetron (ZOFRAN ODT) 4 MG disintegrating tablet 4mg  ODT q6  hours prn nausea/vomit 04/20/16   06/20/16, NP  ondansetron (ZOFRAN) 4 MG tablet Take 1 tablet (4 mg total) by mouth every 8 (eight) hours as needed for nausea or vomiting. 05/30/19   06/01/19, PA-C  orphenadrine (NORFLEX) 100 MG tablet Take 1 tablet (100 mg total) by mouth 2 (two) times daily. 02/06/20   02/08/20, MD      Allergies    Dilaudid [hydromorphone hcl] and Penicillins    Review of Systems   Review of Systems  HENT:  Positive for congestion, postnasal drip, rhinorrhea and sore throat.   Gastrointestinal:  Positive for diarrhea.    Physical Exam Updated Vital Signs BP (!) 158/94   Pulse 76   Temp 98 F (36.7 C)   Resp 18   Ht 5\' 3"  (1.6 m)   Wt 84.4 kg   SpO2 98%   BMI 32.95 kg/m  Physical Exam Vitals and nursing note reviewed.  Constitutional:      General: She is not in acute distress.    Appearance: She is well-developed. She is not ill-appearing, toxic-appearing or diaphoretic.     Comments: Sitting comfortably  HENT:     Head: Normocephalic and atraumatic.     Right Ear: Tympanic membrane, ear canal and external ear normal.     Left Ear: Tympanic membrane, ear canal and external ear normal.     Nose: Congestion and rhinorrhea present.     Mouth/Throat:     Mouth:  Mucous membranes are moist.     Pharynx: Oropharynx is clear. No oropharyngeal exudate or posterior oropharyngeal erythema.     Comments: Uvula midline, without swelling.  No tonsillar exudate, erythema, or swelling appreciated.  Mild erythema posterior oropharynx.  Airway patent. Eyes:     General: No scleral icterus.       Right eye: No discharge.        Left eye: No discharge.     Conjunctiva/sclera: Conjunctivae normal.  Neck:     Comments: No meningismus or torticollis Cardiovascular:     Rate and Rhythm: Normal rate and regular rhythm.     Pulses: Normal pulses.     Heart sounds: No murmur heard. Pulmonary:     Effort: Pulmonary effort is normal. No respiratory distress.      Breath sounds: Normal breath sounds. No stridor. No wheezing or rales.     Comments: CTAB, equal chest rise, able to communicate without difficulty, without increased respiratory effort Chest:     Chest wall: No tenderness.  Abdominal:     General: There is no distension.     Palpations: Abdomen is soft.     Tenderness: There is no abdominal tenderness. There is no guarding.  Musculoskeletal:        General: No swelling.     Cervical back: Neck supple. No rigidity.  Lymphadenopathy:     Cervical: Cervical adenopathy present.  Skin:    General: Skin is warm and dry.     Capillary Refill: Capillary refill takes less than 2 seconds.  Neurological:     Mental Status: She is alert.  Psychiatric:        Mood and Affect: Mood normal.     ED Results / Procedures / Treatments   Labs (all labs ordered are listed, but only abnormal results are displayed) Labs Reviewed  RESP PANEL BY RT-PCR (FLU A&B, COVID) ARPGX2    EKG None  Radiology No results found.  Procedures Procedures    Medications Ordered in ED Medications - No data to display  ED Course/ Medical Decision Making/ A&P                           Medical Decision Making  56 y.o. female presents to the ED for concern of Diarrhea and URI   This involves an extensive number of treatment options, and is a complaint that carries with it a high risk of complications and morbidity.     Past Medical History / Co-morbidities / Social History: No PMHx. Social Determinants of Health include: None  Additional History:  None  Lab Tests: I ordered, and personally interpreted labs.  The pertinent results include:   Negative COVID and flu  Imaging Studies: None  ED Course: Pt well-appearing on exam.  Presenting with 4 days of URI symptoms.  Pt afebrile without tonsillar exudate, erythema, or swelling.  Negative strep.  Presents with mild cervical lymphadenopathy & dysphagia.  No voice changes appreciated.  No  meningismus or torticollis, doubt meningitis.  Presentation non concerning for PTA or RPA.  No trismus or uvula deviation.  Pt able to drink water in ED without difficulty with intact air way.  Tested negative for COVID and flu.  Presentation and history provides low suspicion for acute strep pharyngitis.  Low clinical suspicion for pneumonia.  Abdomen soft, nontender.  Diarrhea likely related to acute URI symptoms.  Low suspicion for concerning acute abdominal pathology.  Without evidence of  otitis media, otitis externa, or mastoiditis on clinical exam.  Clinical diagnosis of viral pharyngitis.  No abx indicated   Pt does not appear dehydrated, but did discuss importance of water rehydration.  Recommended PCP follow up and conservative symptom management.  Prescription sent to pharmacy.  Patient reports satisfaction with today's encounter.  Patient in NAD and in good condition at time of discharge.  Disposition: After consideration the patient's encounter today, I do not feel today's workup suggests an emergent condition requiring admission or immediate intervention beyond what has been performed at this time.  Safe for discharge; instructed to return immediately for worsening symptoms, change in symptoms or any other concerns.  I have reviewed the patients home medicines and have made adjustments as needed.  Discussed course of treatment with the patient, whom demonstrated understanding.  Patient in agreement and has no further questions.    This chart was dictated using voice recognition software.  Despite best efforts to proofread, errors can occur which can change the documentation meaning.         Final Clinical Impression(s) / ED Diagnoses Final diagnoses:  Upper respiratory tract infection, unspecified type  Cough, unspecified type  Diarrhea, unspecified type    Rx / DC Orders ED Discharge Orders          Ordered    loratadine (CLARITIN) 10 MG tablet  Daily        01/10/22 1121     oxymetazoline (AFRIN NASAL SPRAY) 0.05 % nasal spray  2 times daily        01/10/22 1121    Guaifenesin (MUCINEX MAXIMUM STRENGTH) 1200 MG TB12  Every 12 hours        01/10/22 1121              Cecil Cobbs, PA-C 01/10/22 1137    Tanda Rockers A, DO 01/11/22 [phone]

## 2022-01-10 NOTE — Discharge Instructions (Signed)
You were seen in the emergency department today for upper respiratory infection with cough.  We have tested you for COVID and flu and these results were negative.  However, it is still likely that your symptoms are related to a different viral illness.   Treatment is directed at relieving symptoms.  There is no cure for acute viral infections; it is usually best to let them run their course.  Symptoms usually last around 1-2 weeks, though cough may occasionally linger for up to 3-4 weeks.  As discussed, antibiotics are not effective with viral infections, because the infection is caused by a virus, not by bacteria.   Treatments may include:  Increased fluid intake. Sports drinks offer valuable electrolytes, sugars, and fluids.  Breathing heated mist or steam (vaporizer or shower).  Eating chicken soup or other clear broths, and maintaining good nutrition.  Getting plenty of rest.  Using lozenges, tea with honey, or warm salt water for cough relief/sore throat relief (Cepkaol or Halls lozenges are available over-the-counter) Afrin may be used for the first three days, every 12 hours, for congestion relief.  Do not use past three days as this may have the opposite of the intended effect. Increasing usage of your inhaler if you have asthma.   Take over-the-counter Benadryl, Zyrtec, or Claritin to decrease sinus secretions, with Mucinex to help break up remaining mucus Continue to alternate between Tylenol and ibuprofen for pain and fever control.  You may return to work 24 hours after your temperature has returned to normal.  Please follow up with your primary care doctor in 5-7 days for recheck of ongoing symptoms.  Return to emergency department for emergent changing or worsening of symptoms.

## 2022-01-10 NOTE — ED Triage Notes (Signed)
C/o diarrhea, lightheaded, congestion, sore throat, headache x Thursday. Son also sick.
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
